# Patient Record
Sex: Female | Born: 2000 | Race: White | Hispanic: No | Marital: Single | Smoking: Never smoker
Health system: Southern US, Community
[De-identification: ages and names within clinical notes are randomized; demographics above are authoritative.]

## PROBLEM LIST (undated history)

## (undated) DIAGNOSIS — K900 Celiac disease: Secondary | ICD-10-CM

## (undated) DIAGNOSIS — K5900 Constipation, unspecified: Secondary | ICD-10-CM

## (undated) DIAGNOSIS — K589 Irritable bowel syndrome without diarrhea: Secondary | ICD-10-CM

## (undated) HISTORY — PX: UPPER GASTROINTESTINAL ENDOSCOPY: SHX188

## (undated) HISTORY — PX: COLONOSCOPY: SHX174

---
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
## 2020-02-08 ENCOUNTER — Ambulatory Visit: Payer: BC Managed Care – PPO | Admitting: Gastroenterology

## 2020-03-08 ENCOUNTER — Telehealth: Payer: Self-pay | Admitting: Gastroenterology

## 2020-03-08 NOTE — Telephone Encounter (Signed)
Inbound call from patient's mother wanting to schedule an appointment for patient stating she is having severe abdominal pain and cramping.  Informed her next availability will be 03/18/20 with an APP.  Requested a call back from a nurse please; wants to know if there's any way we can see patient this Friday.

## 2020-03-08 NOTE — Telephone Encounter (Signed)
Left message on machine to call back  

## 2020-03-09 ENCOUNTER — Telehealth: Payer: Self-pay | Admitting: Gastroenterology

## 2020-03-09 NOTE — Telephone Encounter (Signed)
Called patient's mother back, and she is very concerned about her daughter's worsening symptoms. She is having sever abdominal, twisting type pain that comes on abruptly. She also feels constantly bloated, even though she eats very little. She has the lactalose breath kit that was given to her at her 12/09/19 office visit, but has not done it because she thought it was pending some other results. She has an apt. On 03/28/20 with Colleen Kennedy PA, but really wants to see someone sooner. Dr. Nandigam, could I put her in one of your  HEM BAND spots on 03/14/20 ? 

## 2020-03-09 NOTE — Telephone Encounter (Signed)
Called patient's Mom and patient is in class right now. She will check with her this afternoon to see if she can make it here  03/14/20

## 2020-03-09 NOTE — Telephone Encounter (Signed)
Agree with sooner appt for office visit on 2/28. Thanks

## 2020-03-09 NOTE — Telephone Encounter (Signed)
Error

## 2020-03-09 NOTE — Telephone Encounter (Signed)
Patients mother is calling to follow up since the patient is not able to be reached. She scheduled a follow up appointment with APP 03/28/20 however the patient is getting worst and they are seeking advise in the meantime.

## 2020-03-10 NOTE — Telephone Encounter (Signed)
Returned call and spoke with the pt and she tells me that she will keep the appt for 3/14 and if she has any further concerns she will call back.   I mentioned her moms call and she states she is doing well at this time.

## 2020-03-10 NOTE — Telephone Encounter (Signed)
Patients mother called and said the patient is not able to come any sooner then the 03/28/20 appointment and she is asked if a nurse can call the patient today to get advise in the mean time.

## 2020-03-27 NOTE — Progress Notes (Signed)
03/27/2020 Veronica Ayala [id number] 05/27/2000   Chief Complaint: Celiac disease follow up, abdominal fullness   History of Present Illness: Veronica Ayala is a 20 year old female with a past medical history of celiac disease diagnosed in 2019, GERD, H. Pylori  and constipation. S/P appendectomy 05/2019. She was previously followed by a pediatric gastroenterologist at [facility] then established her GI care with Dr. Silverio Decamp. Refer to initial GI consult with Dr. Silverio Decamp 12/09/2019 for a comprehensive medical history review.   She presents today with persistent abdominal pain, bloat and intermittent diarrhea.  In general, she does not feel well.  She is concerned that her GI symptoms have not improved after maintaining a strict gluten free diet for the past 2 years.  She describes feeling as if food just sits in her stomach for hours at a time.  She describes having general abdominal pain and cramping which radiates to the lower abdomen.  No specific food triggers.  She tolerates small quantities of cheese and ice cream but typically avoids most dairy products.  She continues to have constipation. She has tried Linzess, Amitiza, MiraLAX, Dulcolax and Ex-Lax in the past without improvement.  She restarted taking MiraLAX 4 days ago.   She is a Electronics engineer at [employer].  She reports her from cafeteria has a specific food bar which contains gluten free foods.  Her most recent EGD was 07/16/2019 at [facility] which showed a normal esophagus and stomach.  The duodenum appeared normal but biopsies were consistent with focal villous blunting and increased intraepithelial lymphocytosis marsh type IIIa.  Gastric biopsies showed chronic inflammation, negative for H. pylori.  CTAP with contrast 05/24/2019: New inflammatory changes in the pelvis with increasing pelvic free fluid. Findings are suspicious for either acute uncomplicated appendicitis or terminal ileitis as detailed above.  EGD 07/16/2019 at  [facility]: Normal esophagus, stomach and duodenum.  Biopsy Report: A. DUODENUM, BIOPSY:  Duodenal mucosa with focal villous blunting and increased  intraepithelial lymphocytes (Marsh type 3a). B. STOMACH, BIOPSY:  Antral-type gastric mucosa with chronic inflammation.  No Helicobacter-like organism is identified by H&E stain  or immunohistochemistry H. pylori Clo Test Negative 07/16/2019  EGD 03/06/2018: Esophagus: Mucosa of the esophagus appeared normal.  Multiple biopsies were performed using cold forceps. Samples sent for histology. Stomach: Mucosa the stomach appeared normal.  Multiple biopsies were performed using cold forceps.  EGD 12/24/2017: 1.  The mucosa of the esophagus appeared normal; multiple biopsies were performed 2.  The mucosa of the stomach appeared normal; multiple biopsies were performed 3.  Abnormal mucosa was found in the duodenal bulb, first part duodenum and second part duodenum; the mucosa was congested and edematous; multiple biopsies were performed 4.  Retroflexion was performed in the stomach and revealed no abnormalities  Colonoscopy 12/24/2017: 1.  The colonic mucosa appeared normal throughout the entire examined colon and in the terminal ileum; multiple biopsies were performed using cold forceps. 2.  Retroflexion was not performed 3.  Revealed no abnormalities of the perianal region   No current outpatient medications on file prior to visit.   No current facility-administered medications on file prior to visit.   Allergies  Allergen Reactions  . Gluten Meal     Other reaction(s): GI Upset (intolerance) Pt has dx of celiac disease     Current Medications, Allergies, Past Medical History, Past Surgical History, Family History and Social History were reviewed in [employer] record.   Review of Systems:  Constitutional: Negative for fever, sweats, chills or weight loss.  Respiratory: Negative for shortness of  breath.   Cardiovascular: Negative for chest pain, palpitations and leg swelling.  Gastrointestinal: See HPI.  Musculoskeletal: Negative for back pain or muscle aches.  Neurological: Negative for dizziness, headaches or paresthesias.   Physical Exam: BP 100/60   Pulse 62   Ht 5' 6"  (1.676 m)   Wt 137 lb (62.1 kg)   SpO2 97%   BMI 22.11 kg/m   General: Well developed 20 year old female in no acute distress. Head: Normocephalic and atraumatic. Eyes: No scleral icterus. Conjunctiva pink . Ears: Normal auditory acuity. Lungs: Clear throughout to auscultation. Heart: Regular rate and rhythm, no murmur. Abdomen: Soft, nondistended. Mild tenderness to the right mid abdomen without rebound or guarding.  No masses or hepatomegaly. Normal bowel sounds x 4 quadrants.  Rectal: Deferred.  Musculoskeletal: Symmetrical with no gross deformities. Extremities: No edema. Neurological: Alert oriented x 4. No focal deficits.  Psychological: Alert and cooperative. Normal mood and affect  Assessment and Recommendations:  71.  20 year old female with celiac disease -Continue gluten-free diet -Check TTG IgA antibody and TTG IgG antibody to assess for inadvertent gluten exposure/digestion  2.  Generalized abdominal pain and bloat with constipation  -Discussed a trial with Xifaxan to empirically treat for SIBO, however, the patient prefers to undergo SIBO breath testing prior to initiating treatment -SIBO breath test -Lactaid with each dairy product -MiraLAX once to twice daily for now -Dicyclomine 10 mg 1 p.o. 3 times daily as needed abdominal pain -Consider small bowel MRI for rule out small bowel Crohn's disease  -Follow-up appoint with Dr. Silverio Decamp in 2 to 3 months -Patient to call our office if her symptoms worsen  3. S/P appendectomy secondary to acute appendicitis 05/2019. Path report - Acute appendicitis with necrosis, serositis and periappendiceal inflammation

## 2020-03-28 ENCOUNTER — Other Ambulatory Visit (INDEPENDENT_AMBULATORY_CARE_PROVIDER_SITE_OTHER): Payer: BC Managed Care – PPO

## 2020-03-28 ENCOUNTER — Encounter: Payer: Self-pay | Admitting: Nurse Practitioner

## 2020-03-28 ENCOUNTER — Ambulatory Visit (INDEPENDENT_AMBULATORY_CARE_PROVIDER_SITE_OTHER): Payer: BC Managed Care – PPO | Admitting: Nurse Practitioner

## 2020-03-28 VITALS — BP 100/60 | HR 62 | Ht 66.0 in | Wt 137.0 lb

## 2020-03-28 DIAGNOSIS — R197 Diarrhea, unspecified: Secondary | ICD-10-CM

## 2020-03-28 DIAGNOSIS — R140 Abdominal distension (gaseous): Secondary | ICD-10-CM

## 2020-03-28 DIAGNOSIS — K900 Celiac disease: Secondary | ICD-10-CM

## 2020-03-28 LAB — CBC WITH DIFFERENTIAL/PLATELET
Basophils Absolute: 0 10*3/uL (ref 0.0–0.1)
Basophils Relative: 0.9 % (ref 0.0–3.0)
Eosinophils Absolute: 0.1 10*3/uL (ref 0.0–0.7)
Eosinophils Relative: 1.8 % (ref 0.0–5.0)
HCT: 40.3 % (ref 36.0–46.0)
Hemoglobin: 13.8 g/dL (ref 12.0–15.0)
Lymphocytes Relative: 36.4 % (ref 12.0–46.0)
Lymphs Abs: 1.9 10*3/uL (ref 0.7–4.0)
MCHC: 34.1 g/dL (ref 30.0–36.0)
MCV: 84.7 fl (ref 78.0–100.0)
Monocytes Absolute: 0.5 10*3/uL (ref 0.1–1.0)
Monocytes Relative: 8.6 % (ref 3.0–12.0)
Neutro Abs: 2.8 10*3/uL (ref 1.4–7.7)
Neutrophils Relative %: 52.3 % (ref 43.0–77.0)
Platelets: 231 10*3/uL (ref 150.0–400.0)
RBC: 4.76 Mil/uL (ref 3.87–5.11)
RDW: 14.2 % (ref 11.5–14.6)
WBC: 5.3 10*3/uL (ref 4.5–10.5)

## 2020-03-28 LAB — C-REACTIVE PROTEIN: CRP: <1 mg/dL (ref 0.5–20.0)

## 2020-03-28 MED ORDER — DICYCLOMINE HCL 10 MG PO CAPS: 10.0000 mg | ORAL_CAPSULE | Freq: Three times a day (TID) | ORAL | 0 refills | Status: DC | PRN

## 2020-03-28 NOTE — Patient Instructions (Addendum)
If you are age 20 or younger, your body mass index should be between 19-25. Your Body mass index is 22.11 kg/m. If this is out of the aformentioned range listed, please consider follow up with your Primary Care Provider.   MEDICATION: We have sent the following medication to your pharmacy for you to pick up at your convenience: Dicyclomine 10 MG take 3 times a day as needed for abdominal pain.  Please take Lactaid with each dairy product.  We have scheduled you a follow up with Dr. Silverio Decamp on 06/02/20 at 9:50am  LABS:  Lab work has been ordered for you today. Our lab is located in the basement. Press "B" on the elevator. The lab is located at the first door on the left as you exit the elevator.  HEALTHCARE LAWS AND MY CHART RESULTS: Due to recent changes in healthcare laws, you may see the results of your imaging and laboratory studies on MyChart before your provider has had a chance to review them.   We understand that in some cases there may be results that are confusing or concerning to you. Not all laboratory results come back in the same time frame and the provider may be waiting for multiple results in order to interpret others.  Please give [place] 48 hours in order for your provider to thoroughly review all the results before contacting the office for clarification of your results.   You have been given a testing kit to check for small intestine bacterial overgrowth (SIBO) which is completed by a company named [facility].   Make sure to return your test in the mail using the return mailing label given to you along with the kit. Your demographic and insurance information have already been sent to the company and they should be in contact with you over the next week regarding this test.   [facility] will collect an upfront charge of $99.74 for commercial insurance plans and $209.74 is you are paying cash. Make sure to discuss with [facility] PRIOR to having the test if they have  gotten informatoin from your insurance company as to how much your testing will cost out of pocket, if any. Please keep in mind that you will be getting a call from phone number [phone] or a similar number. If you do not hear from them within this time frame, please call our office at [phone].   It was great seeing you today! Thank you for entrusting me with your care and choosing [facility].  Noralyn Pick, CRNP

## 2020-03-29 LAB — TISSUE TRANSGLUTAMINASE ABS,IGG,IGA
(tTG) Ab, IgA: 21.2 U/mL — ABNORMAL HIGH
(tTG) Ab, IgG: <1 U/mL

## 2020-03-29 LAB — IGA: Immunoglobulin A: 27 mg/dL — ABNORMAL LOW (ref 47–310)

## 2020-03-29 LAB — IGG: IgG (Immunoglobin G), Serum: 1443 mg/dL (ref 600–1640)

## 2020-04-01 NOTE — Telephone Encounter (Signed)
See lab notes and recommendations from Dr. Silverio Decamp.

## 2020-05-07 NOTE — Progress Notes (Signed)
Reviewed and agree with documentation and assessment and plan. K. Veena Nachmen Mansel , MD   

## 2020-06-02 ENCOUNTER — Ambulatory Visit: Payer: BC Managed Care – PPO | Admitting: Gastroenterology

## 2020-06-08 ENCOUNTER — Encounter: Payer: Self-pay | Admitting: Physician Assistant

## 2020-06-08 ENCOUNTER — Ambulatory Visit (INDEPENDENT_AMBULATORY_CARE_PROVIDER_SITE_OTHER): Payer: BC Managed Care – PPO | Admitting: Physician Assistant

## 2020-06-08 VITALS — BP 96/70 | HR 96 | Ht 66.0 in | Wt 141.2 lb

## 2020-06-08 DIAGNOSIS — K900 Celiac disease: Secondary | ICD-10-CM

## 2020-06-08 DIAGNOSIS — K5904 Chronic idiopathic constipation: Secondary | ICD-10-CM

## 2020-06-08 NOTE — Patient Instructions (Signed)
If you are age 20 or older, your body mass index should be between 23-30. Your Body mass index is 22.8 kg/m. If this is out of the aforementioned range listed, please consider follow up with your Primary Care Provider.  If you are age 21 or younger, your body mass index should be between 19-25. Your Body mass index is 22.8 kg/m. If this is out of the aformentioned range listed, please consider follow up with your Primary Care Provider.   You have been scheduled for an MRI at [facility] on 06/20/2020. Your appointment time is 10:00 am. Please arrive at 8:30 am for your appointment time for registration purposes. Please make certain not to have anything to eat or drink 6 hours prior to your test. In addition, if you have any metal in your body, have a pacemaker or defibrillator, please be sure to let your ordering physician know. This test typically takes 45 minutes to 1 hour to complete. Should you need to reschedule, please call [phone] to do so.  Continue Miralax daily Add Sena/Senakot 1-2 tablets at bedtime  Continue to follow a Gluten free diet.  Try Pepcid 1-2 tablets as needed before a meal.  Amy Esterwood, PA-C recommends that you complete a bowel purge (to clean out your bowels). Please do the following: Purchase a bottle of Miralax over the counter as well as a box of 5 mg dulcolax tablets. Take 4 dulcolax tablets. Wait 1 hour. You will then drink 6-8 capfuls of Miralax mixed in an adequate amount of water/juice/gatorade (you may choose which of these liquids to drink) over the next 2-3 hours. You should expect results within 1 to 6 hours after completing the bowel purge.  Try to do your bowel this Friday.  Follow up pending the results of your MR Enterography.  Thank you for entrusting me with your care and choosing [facility].  Amy Esterwood, PA-C

## 2020-06-09 ENCOUNTER — Encounter: Payer: Self-pay | Admitting: Physician Assistant

## 2020-06-09 NOTE — Progress Notes (Signed)
Subjective:    Patient ID: Veronica Ayala, female    DOB: [birth year], 20 y.o.   MRN: [id number]  HPI Veronica Ayala is a pleasant 20 year old white female, established with Dr. Nathaniel Ayala who was initially seen in consultation by Veronica Ayala on 12/09/2019 to establish with known previous diagnosis of celiac disease, with complaints of GERD constipation and abdominal fullness. She was diagnosed with celiac disease in December 2019 and was also H. pylori positive.  She completed a course of treatment for H. pylori and had confirmed eradication. She believes she has been adhering to a strictly gluten-free diet, and relates ongoing frustrations with symptoms despite being strict with maintaining gluten-free.  She is a Consulting civil engineer at [employer] and relates that they have a gluten-free section where she is currently living but next year will be in an apartment and has some concerns about ability to eat gluten-free. After her visit in November she had repeat TTG and IgA done.  TTG was elevated at 21.2, IgA within normal limits. Regarding constipation she had tried Amitiza and Linzess in the past both of which cause nausea, she has been using MiraLAX fairly regularly and does feel that this helps though still does not have regular bowel movements. Breath testing to exclude small intestinal bacterial overgrowth was to be done but she has not completed that. Sed rate was 6, B12 and folate levels normal, iron studies normal , And normal CBC.  She is expressing frustration today with ongoing symptoms and is not convinced that all of her symptoms are from celiac disease or that the celiac is active as she has been strict with maintaining gluten-free.  She says she has periods of time with decrease in appetite, general sense of fullness and bloating and a "backed up" feeling which is present most of the time.  She is having bowel movements with taking MiraLAX daily but says she only passes a small amount of stool  and this does not offer her any relief of symptoms.  She is not seeing any melena or hematochezia.  She had tried Protonix for short period of time for upper intestinal acid type sensation but she said this made her feel more uncomfortable. 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 Review of Systems Pertinent positive and negative review of systems were noted in the above HPI section.  All other review of systems was otherwise negative.  Outpatient Encounter Medications as of 06/08/2020  Medication Sig  . dicyclomine (BENTYL) 10 MG capsule Take 1 capsule (10 mg total) by mouth 3 (three) times daily as needed for spasms. (Patient taking differently: Take 10 mg by mouth as needed for spasms.)  . pantoprazole (PROTONIX) 20 MG tablet Take 20 mg by mouth as needed.   No facility-administered encounter medications on file as of 06/08/2020.   Allergies  Allergen Reactions  . Gluten Meal     Other reaction(s): GI Upset (intolerance) Pt has dx of celiac disease    Patient Active Problem List   Diagnosis Date Noted  . Appendicitis 05/25/2019  . Acute appendicitis with perforation, localized peritonitis, and gangrene, without abscess s/p lap appendectomy 05/24/2019 05/24/2019  . Celiac disease 12/30/2017  . H. pylori infection 12/30/2017  . Abdominal pain, chronic, generalized 12/05/2017  . Irritable bowel syndrome with constipation 12/05/2017  . Defecation urgency 12/05/2017  . Early satiety 12/05/2017  . Rectal tenesmus 12/05/2017  . Decreased appetite 12/05/2017  . 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 Marital status: Single    Spouse name: Not on file  . Number of children: Not on file  . Years of education: Not on file  . Highest education level: Not on file  Occupational History  . Not on file  Tobacco Use  . Smoking status: Never Smoker  . Smokeless tobacco: Never Used  Vaping Use  . Vaping Use: Never used   Substance and Sexual Activity  . Alcohol use: Never  . Drug use: Never  . Sexual activity: Yes    Birth control/protection: None  Other Topics Concern  . Not on file  Social History Narrative  . Not on file   Social Determinants of Health   Financial Resource Strain: Not on file  Food Insecurity: Not on file  Transportation Needs: Not on file  Physical Activity: Not on file  Stress: Not on file  Social Connections: Not on file  Intimate Partner Violence: Not on file    Ms. [name]'s family history includes Breast cancer in her maternal grandmother; Prostate cancer in her maternal grandfather.      Objective:    Vitals:   06/08/20 1512  BP: 96/70  Pulse: 96    Physical Exam Well-developed well-nourished young WF  in no acute distress.  Height, ONGEXB,284  BMI22.8 accompanied by mom  HEENT; nontraumatic normocephalic, EOMI, PE R LA, sclera anicteric. Oropharynx; not examined today Neck; supple, no JVD Cardiovascular; regular rate and rhythm with S1-S2, no murmur rub or gallop Pulmonary; Clear bilaterally Abdomen; soft, no focal tenderness, , nondistended, no palpable mass or hepatosplenomegaly, bowel sounds are active Rectal; not done Skin; benign exam, no jaundice rash or appreciable lesions Extremities; no clubbing cyanosis or edema skin warm and dry Neuro/Psych; alert and oriented x4, grossly nonfocal mood and affect appropriate       Assessment & Plan:   #42 20 year old female diagnosed with celiac disease in 2019 with prior work-up being done through her pediatrician. Patient has persistent symptoms of ongoing abdominal discomfort, bloating, intermittent indigestion and acidic symptoms and ongoing issues with constipation despite following a strict gluten-free diet  TTG in November 2021 elevated at 21.2  Previous trials of Linzess and Amitiza, intolerant secondary to nausea Prior trial of Protonix, no benefit  It is not clear if all of her symptoms are  secondary to active celiac disease though elevated TTG certainly suggest that, she may have overlap of IBS, cannot rule out SIBO, rule out IBD, rule out refractory celiac, rule out jejunitis  Plan; continue strict gluten-free diet Patient will complete breath testing to rule out SIBO this week, treat with Xifaxan if positive Patient will continue educating herself regarding options for strict gluten-free diet, she is advised to read about different alcohols and potential gluten content for option socially etc. For significant constipation we have discussed a bowel purge with MiraLAX which she will complete later this week and then go back to MiraLAX 17 g in 8 ounces of water once daily. Add trial of Senokot-S 1-2 at bedtime which she can do in addition to MiraLAX. Trial of Pepcid AC 1-2 as needed as needed Patient will be scheduled for MR enterography. Further plans pending results of above.    Floyde Dingley S Jenilee Franey PA-C 06/09/2020   Cc: Marcene Corning, MD

## 2020-06-13 DIAGNOSIS — R140 Abdominal distension (gaseous): Secondary | ICD-10-CM | POA: Diagnosis not present

## 2020-06-13 DIAGNOSIS — K900 Celiac disease: Secondary | ICD-10-CM | POA: Diagnosis not present

## 2020-06-13 DIAGNOSIS — R197 Diarrhea, unspecified: Secondary | ICD-10-CM | POA: Diagnosis not present

## 2020-06-16 NOTE — Progress Notes (Signed)
Reviewed and agree with documentation and assessment and plan. K. Veena Caileigh Canche , MD   

## 2020-06-20 ENCOUNTER — Ambulatory Visit (HOSPITAL_COMMUNITY)
Admission: RE | Admit: 2020-06-20 | Discharge: 2020-06-20 | Disposition: A | Discharge status: Home / Self Care | Payer: BC Managed Care – PPO | Source: Ambulatory Visit | Attending: Physician Assistant | Admitting: Physician Assistant

## 2020-06-20 ENCOUNTER — Other Ambulatory Visit: Payer: Self-pay

## 2020-06-20 DIAGNOSIS — K900 Celiac disease: Secondary | ICD-10-CM | POA: Diagnosis not present

## 2020-06-20 DIAGNOSIS — Z8719 Personal history of other diseases of the digestive system: Secondary | ICD-10-CM | POA: Diagnosis not present

## 2020-06-20 DIAGNOSIS — K5904 Chronic idiopathic constipation: Secondary | ICD-10-CM | POA: Insufficient documentation

## 2020-06-20 DIAGNOSIS — M419 Scoliosis, unspecified: Secondary | ICD-10-CM | POA: Diagnosis not present

## 2020-06-20 DIAGNOSIS — K5900 Constipation, unspecified: Secondary | ICD-10-CM | POA: Diagnosis not present

## 2020-06-20 DIAGNOSIS — R140 Abdominal distension (gaseous): Secondary | ICD-10-CM | POA: Diagnosis not present

## 2020-06-20 IMAGING — MR MR [PERSON_NAME] PELVIS W/CM
23 of 25 series · 46 of 48 positions shown · IV contrast (6ml GADAVIST)
Comparison: Multiple exams, including CT abdomen [DATE]

CLINICAL DATA: Celiac disease in chronic idiopathic constipation.
Ongoing abdominal discomfort with bloating and intermittent
indigestion. Gluten free diet.

EXAM:
MR ABDOMEN AND PELVIS WITHOUT AND WITH CONTRAST (MR ENTEROGRAPHY)
TECHNIQUE: Multiplanar, multisequence MRI of the abdomen and pelvis was
performed both before and during bolus administration of intravenous
contrast. Negative oral contrast VoLumen was given.
CONTRAST:  6mL GADAVIST GADOBUTROL 1 MMOL/ML IV SOLN

[Series 6: T2 · axial · 6.0mm · 1.45mm/px · 1 of 60 slices shown (1 of 2)]
[im 1/60]
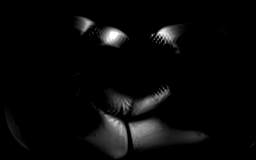

[Series 7: T2 · coronal · 6.0mm · 1.76mm/px · 1 of 32 slices shown (2 of 2)]
[im 1/32]
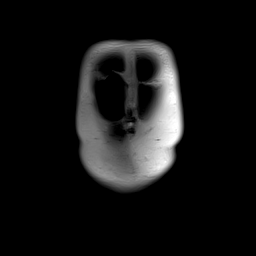

[Series 8: bSSFP · coronal · 6.0mm · 0.88mm/px · 1 of 32 slices shown]
[im 1/32]
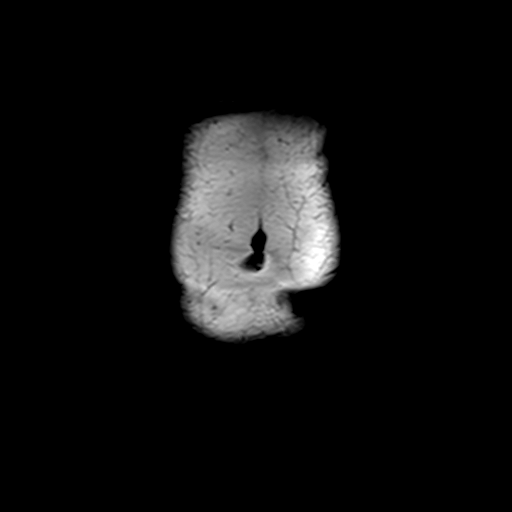

[Series 9: DWI · axial · 6.0mm · 1.49mm/px · z∈[-283,+127]mm · 2 of 115 slices shown (1 of 2)]
[im 1/115]
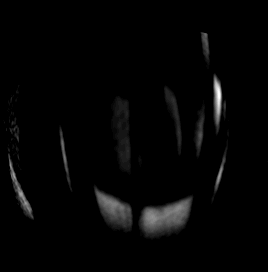
[im 115/115]
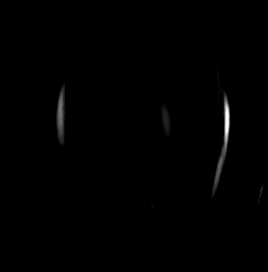

[Series 10: DWI · axial · 6.0mm · 1.49mm/px · 1 of 57 slices shown (2 of 2)]
[im 1/57]
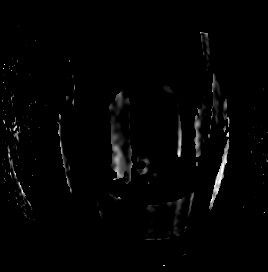

[Series 11: T1 dynamic · axial · 3.0mm · 1.16mm/px · z∈[-272,+109]mm · 2 of 128 slices shown (1 of 12)]
[im 1/128]
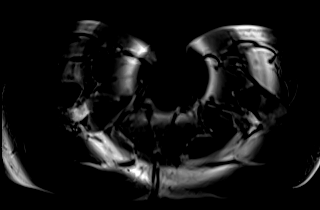
[im 128/128]
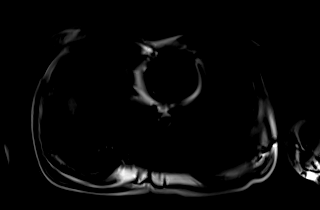

[Series 12: T1 dynamic · axial · 3.0mm · 1.16mm/px · z∈[-272,+109]mm · 3 of 128 slices shown (2 of 12)]
[im 1/128]
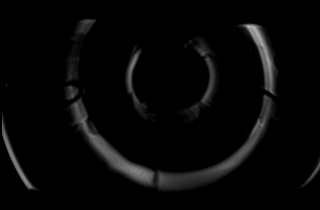
[im 64/128]
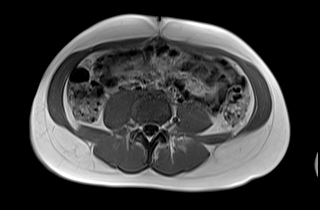
[im 128/128]
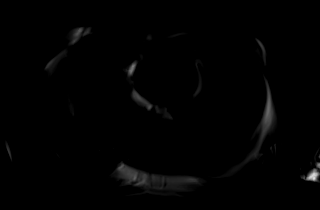

[Series 14: T1 dynamic · axial · 3.0mm · 1.16mm/px · z∈[-272,+109]mm · 3 of 128 slices shown (3 of 12)]
[im 1/128]
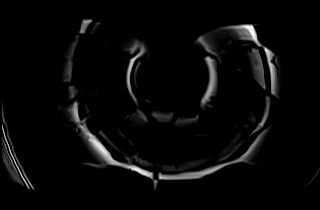
[im 64/128]
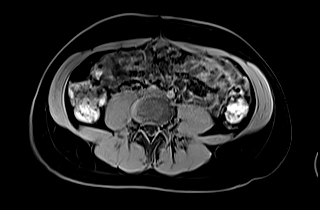
[im 128/128]
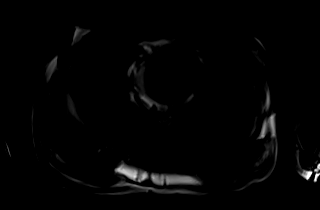

[Series 22: T1 dynamic · axial · 3.0mm · 1.16mm/px · z∈[-272,+109]mm · 3 of 128 slices shown (4 of 12)]
[im 1/128]
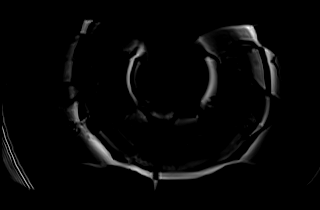
[im 64/128]
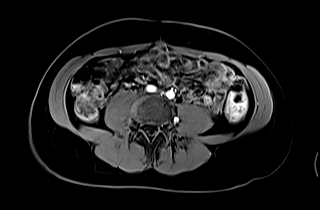
[im 128/128]
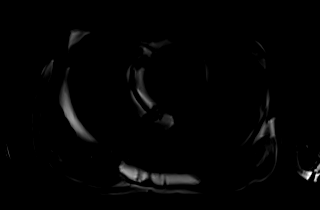

[Series 23: T1 dynamic · axial · 3.0mm · 1.16mm/px · z∈[-272,+109]mm · 3 of 128 slices shown (5 of 12)]
[im 1/128]
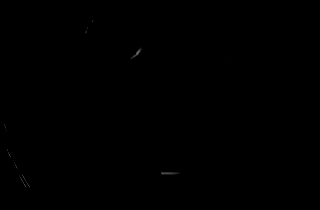
[im 64/128]
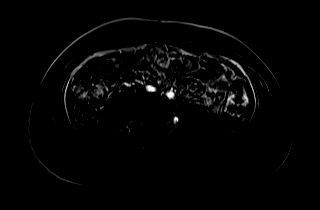
[im 128/128]
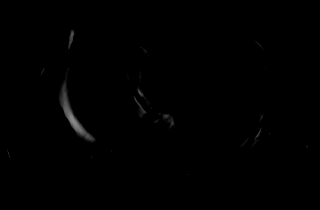

[Series 30: T1 dynamic · axial · 3.0mm · 1.16mm/px · z∈[-272,+109]mm · 3 of 128 slices shown (6 of 12)]
[im 1/128]
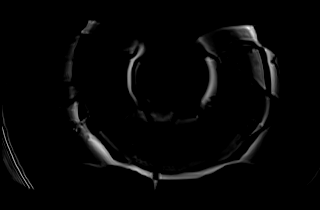
[im 64/128]
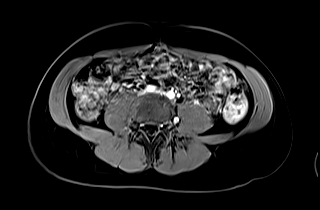
[im 128/128]
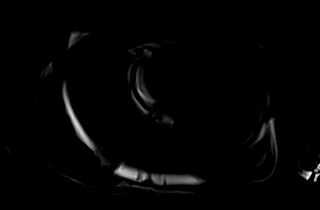

[Series 31: T1 dynamic · axial · 3.0mm · 1.16mm/px · z∈[-272,+109]mm · 3 of 128 slices shown (7 of 12)]
[im 1/128]
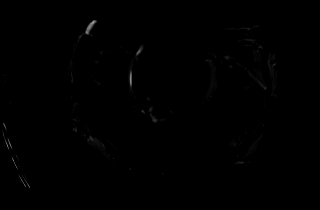
[im 64/128]
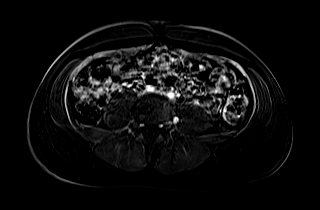
[im 128/128]
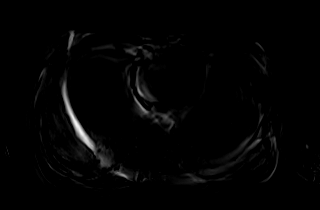

[Series 38: T1 dynamic · axial · 3.0mm · 1.16mm/px · z∈[-272,+109]mm · 3 of 128 slices shown (8 of 12)]
[im 1/128]
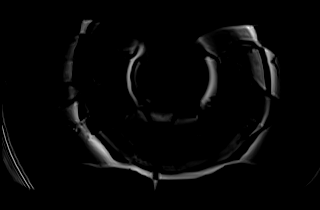
[im 64/128]
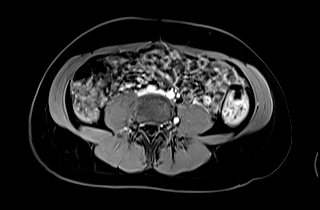
[im 128/128]
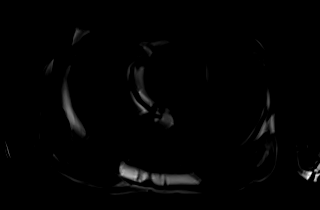

[Series 39: T1 dynamic · axial · 3.0mm · 1.16mm/px · z∈[-272,+109]mm · 3 of 128 slices shown (9 of 12)]
[im 1/128]
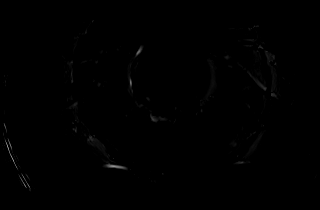
[im 64/128]
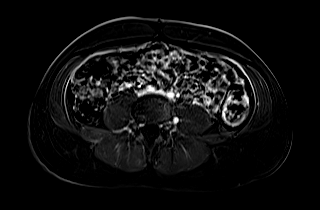
[im 128/128]
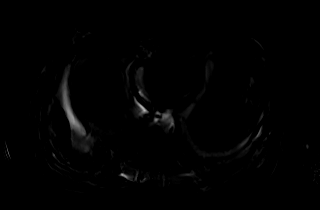

[Series 41: T1 dynamic · coronal · 3.0mm · 1.41mm/px · 2 of 64 slices shown (10 of 12)]
[im 1/64]
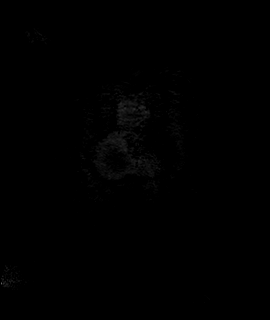
[im 64/64]
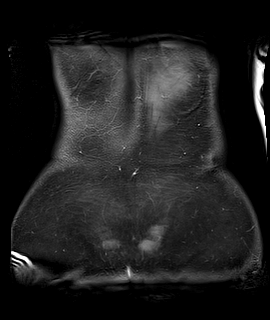

[Series 48: T1 dynamic · axial · 3.0mm · 1.16mm/px · z∈[-272,+109]mm · 3 of 128 slices shown (11 of 12)]
[im 1/128]
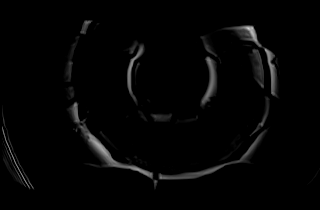
[im 64/128]
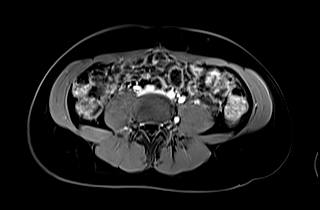
[im 128/128]
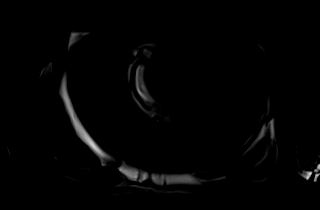

[Series 49: T1 dynamic · axial · 3.0mm · 1.16mm/px · z∈[-272,+109]mm · 3 of 128 slices shown (12 of 12)]
[im 1/128]
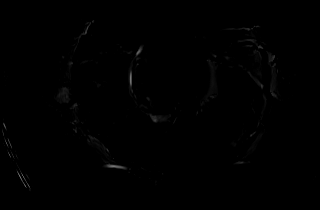
[im 64/128]
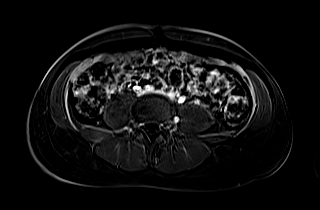
[im 128/128]
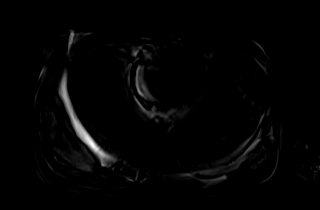

[Series 50: cor cine true-fisp · coronal · 10.0mm · 0.74mm/px · 1 of 3 slices shown (1 of 6)]
[im 1/3]
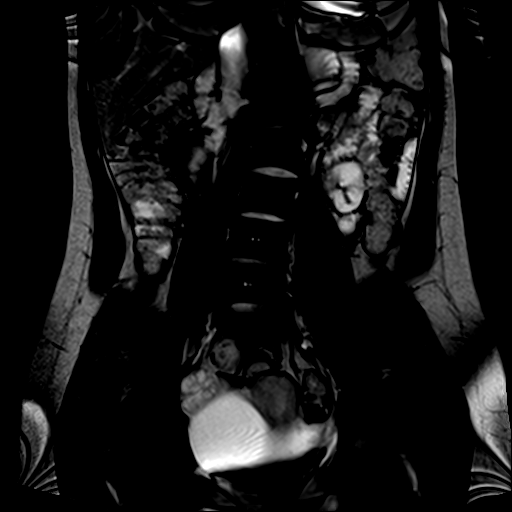

[Series 51: cor cine true-fisp · coronal · 10.0mm · 0.74mm/px · 1 of 3 slices shown (2 of 6)]
[im 1/3]
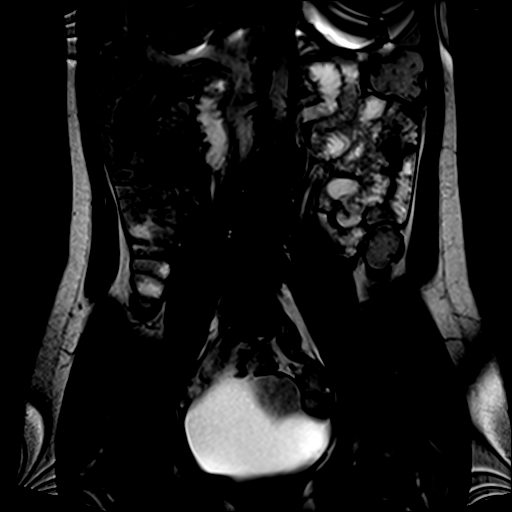

[Series 52: cor cine true-fisp · coronal · 10.0mm · 0.74mm/px · 1 of 3 slices shown (3 of 6)]
[im 1/3]
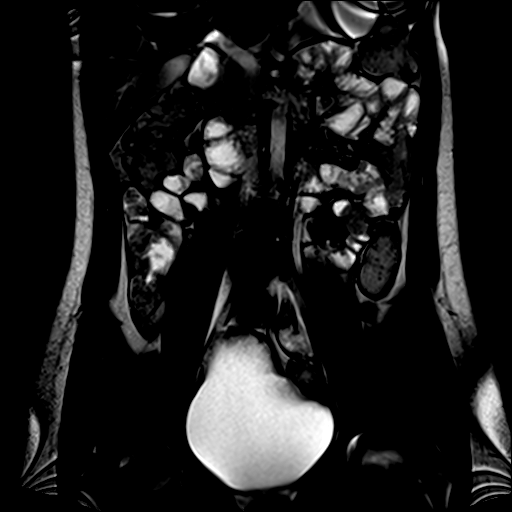

[Series 53: cor cine true-fisp · coronal · 10.0mm · 0.74mm/px · 1 of 3 slices shown (4 of 6)]
[im 1/3]
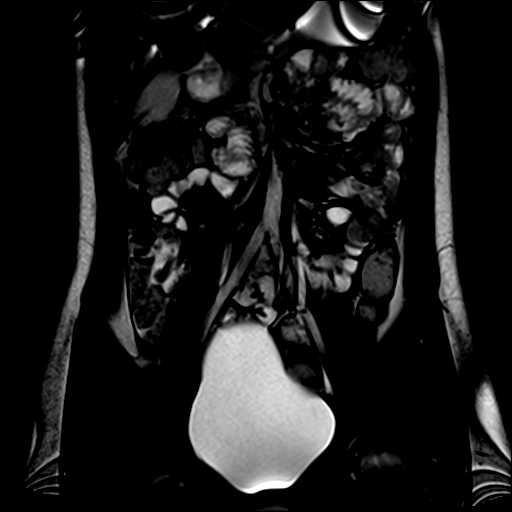

[Series 54: cor cine true-fisp · coronal · 10.0mm · 0.74mm/px · 1 of 3 slices shown (5 of 6)]
[im 1/3]
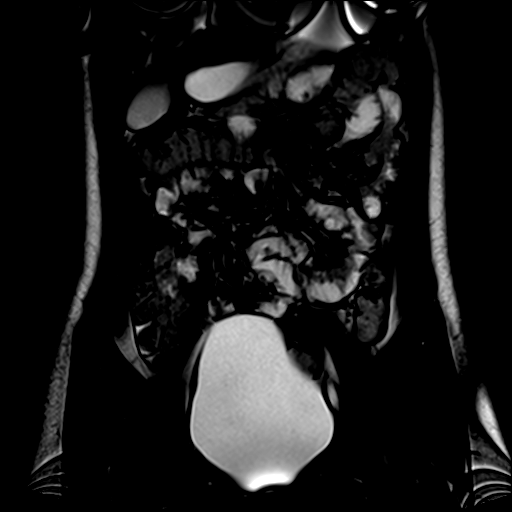

[Series 55: cor cine true-fisp · coronal · 10.0mm · 0.74mm/px · 1 of 3 slices shown (6 of 6)]
[im 1/3]
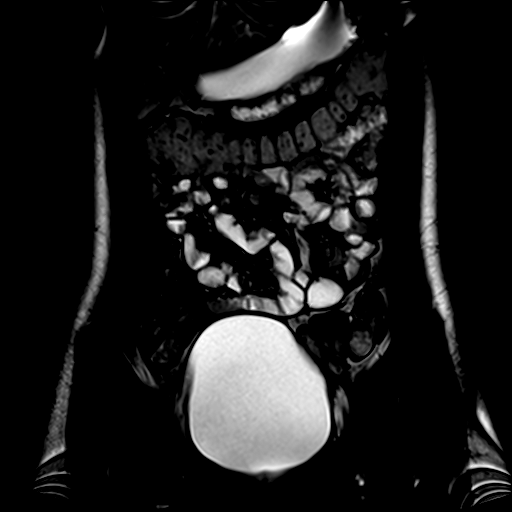

[46 of 48 positions shown; findings below may reference images not displayed]

FINDINGS: COMBINED FINDINGS FOR BOTH MR ABDOMEN AND PELVIS

Despite efforts by the technologist and patient, motion artifact is
present on today's exam and could not be eliminated. This reduces
exam sensitivity and specificity.

Lower chest: Unremarkable

Hepatobiliary: Unremarkable

Pancreas:  Unremarkable

Spleen:  Unremarkable

Adrenals/Urinary Tract: 0.8 cm left mid kidney cyst. Adrenal glands
unremarkable. Otherwise unremarkable.

Stomach/Bowel: The distal esophagus and stomach appear unremarkable.
The duodenal bulb and the remainder of the duodenum demonstrates
normal caliber and wall appearance without specific inflammatory
findings.

Jejunal folds appear unremarkable and non-thickened at this time. No
dilated bowel or abnormal bowel wall mucosal enhancement. The
terminal ileum appears normal and no specific mass is observed.
Distal colonic stool appears semi solid with some fluid levels in
the descending colon. Overall no active inflammation or specific
abnormality is identified.

Vascular/Lymphatic:  Unremarkable

Reproductive: Unremarkable

Other:  Trace free pelvic fluid.

Musculoskeletal: Mild levoconvex thoracolumbar scoliosis with
epicenter at the L1 vertebral level. Moderate rotary component.
IMPRESSION: 1. There are some air fluid levels in nondilated descending colon
but distal colon contents appear semi solid and there is no wall
thickening, abnormal mucosal or wall enhancement, or other specific
gastric or bowel abnormality identified. The duodenum appears
unremarkable.
2. Trace free pelvic fluid-possibly physiologic.
3. Levoconvex thoracolumbar scoliosis with rotary component.

## 2020-06-20 IMAGING — MR MR [PERSON_NAME] ABD W/CM
23 of 25 series · 46 of 48 positions shown · IV contrast (6ml GADAVIST)
Comparison: Multiple exams, including CT abdomen [DATE]

CLINICAL DATA: Celiac disease in chronic idiopathic constipation.
Ongoing abdominal discomfort with bloating and intermittent
indigestion. Gluten free diet.

EXAM:
MR ABDOMEN AND PELVIS WITHOUT AND WITH CONTRAST (MR ENTEROGRAPHY)
TECHNIQUE: Multiplanar, multisequence MRI of the abdomen and pelvis was
performed both before and during bolus administration of intravenous
contrast. Negative oral contrast VoLumen was given.
CONTRAST:  6mL GADAVIST GADOBUTROL 1 MMOL/ML IV SOLN

[Series 6: T2 · axial · 6.0mm · 1.45mm/px · 1 of 60 slices shown (1 of 2)]
[im 1/60]
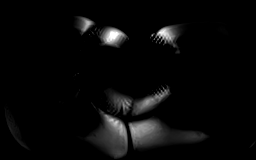

[Series 7: T2 · coronal · 6.0mm · 1.76mm/px · 1 of 32 slices shown (2 of 2)]
[im 1/32]
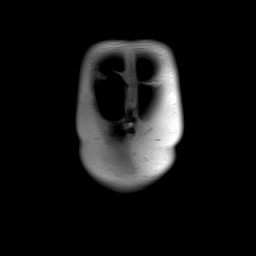

[Series 8: bSSFP · coronal · 6.0mm · 0.88mm/px · 1 of 32 slices shown]
[im 1/32]
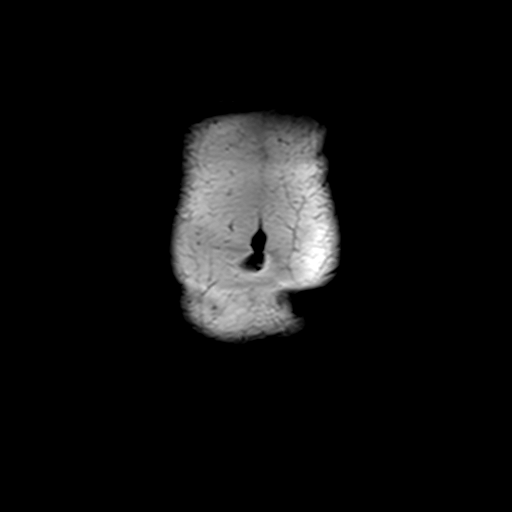

[Series 9: DWI · axial · 6.0mm · 1.49mm/px · z∈[-283,+127]mm · 2 of 115 slices shown (1 of 2)]
[im 1/115]
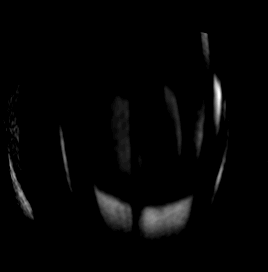
[im 115/115]
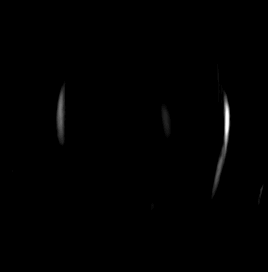

[Series 10: DWI · axial · 6.0mm · 1.49mm/px · 1 of 57 slices shown (2 of 2)]
[im 1/57]
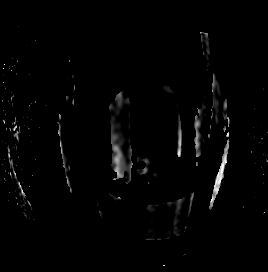

[Series 11: T1 dynamic · axial · 3.0mm · 1.16mm/px · z∈[-272,+109]mm · 2 of 128 slices shown (1 of 12)]
[im 1/128]
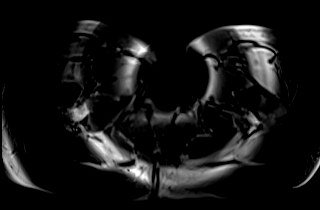
[im 128/128]
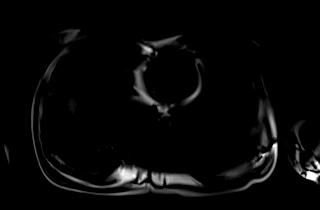

[Series 12: T1 dynamic · axial · 3.0mm · 1.16mm/px · z∈[-272,+109]mm · 3 of 128 slices shown (2 of 12)]
[im 1/128]
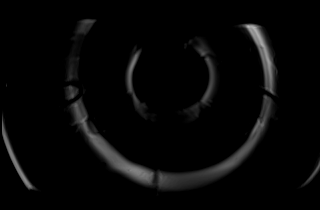
[im 64/128]
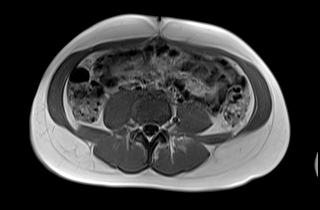
[im 128/128]
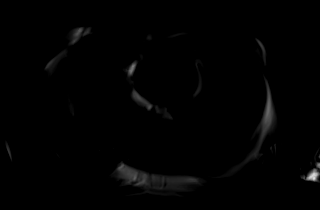

[Series 14: T1 dynamic · axial · 3.0mm · 1.16mm/px · z∈[-272,+109]mm · 3 of 128 slices shown (3 of 12)]
[im 1/128]
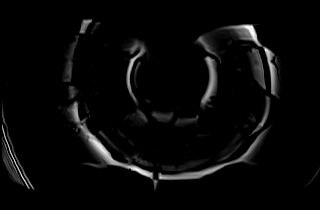
[im 64/128]
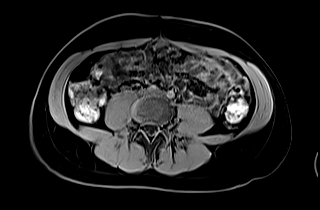
[im 128/128]
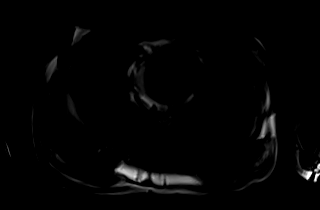

[Series 22: T1 dynamic · axial · 3.0mm · 1.16mm/px · z∈[-272,+109]mm · 3 of 128 slices shown (4 of 12)]
[im 1/128]
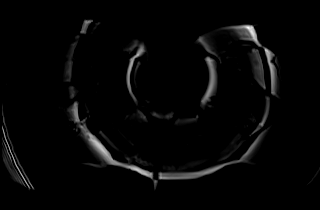
[im 64/128]
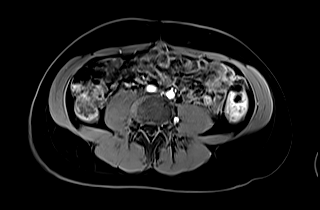
[im 128/128]
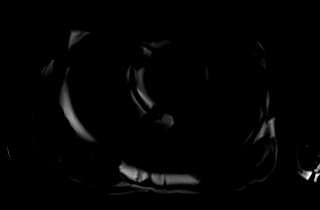

[Series 23: T1 dynamic · axial · 3.0mm · 1.16mm/px · z∈[-272,+109]mm · 3 of 128 slices shown (5 of 12)]
[im 1/128]
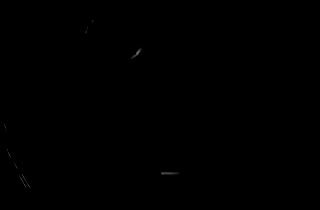
[im 64/128]
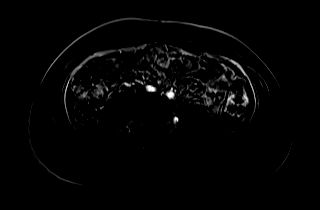
[im 128/128]
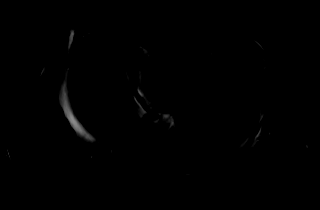

[Series 30: T1 dynamic · axial · 3.0mm · 1.16mm/px · z∈[-272,+109]mm · 3 of 128 slices shown (6 of 12)]
[im 1/128]
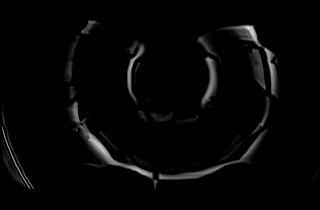
[im 64/128]
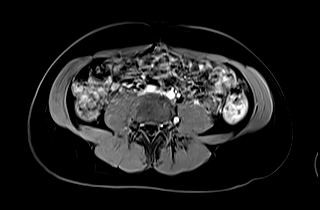
[im 128/128]
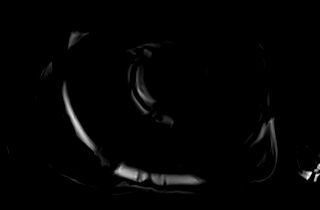

[Series 31: T1 dynamic · axial · 3.0mm · 1.16mm/px · z∈[-272,+109]mm · 3 of 128 slices shown (7 of 12)]
[im 1/128]
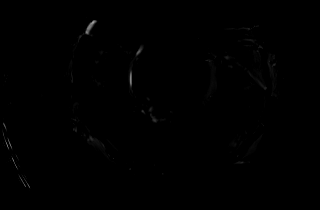
[im 64/128]
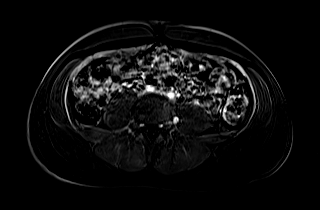
[im 128/128]
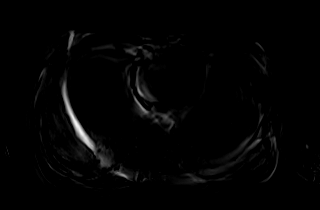

[Series 38: T1 dynamic · axial · 3.0mm · 1.16mm/px · z∈[-272,+109]mm · 3 of 128 slices shown (8 of 12)]
[im 1/128]
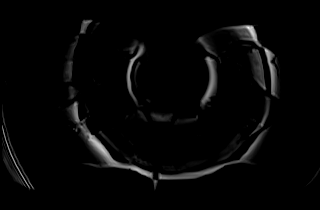
[im 64/128]
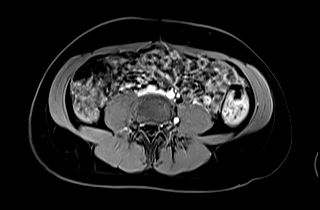
[im 128/128]
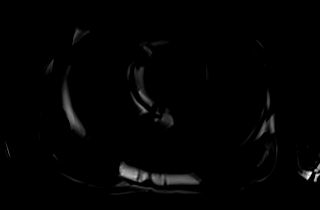

[Series 39: T1 dynamic · axial · 3.0mm · 1.16mm/px · z∈[-272,+109]mm · 3 of 128 slices shown (9 of 12)]
[im 1/128]
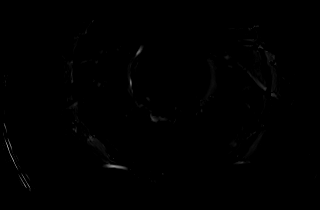
[im 64/128]
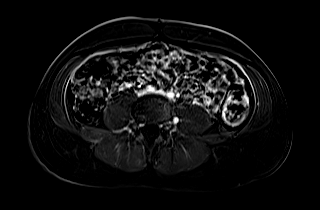
[im 128/128]
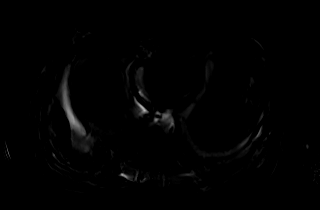

[Series 41: T1 dynamic · coronal · 3.0mm · 1.41mm/px · 2 of 64 slices shown (10 of 12)]
[im 1/64]
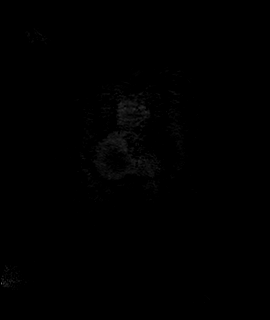
[im 64/64]
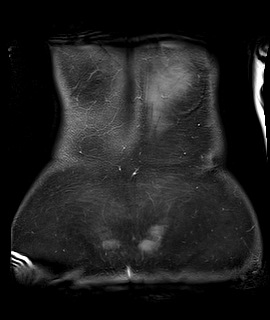

[Series 48: T1 dynamic · axial · 3.0mm · 1.16mm/px · z∈[-272,+109]mm · 3 of 128 slices shown (11 of 12)]
[im 1/128]
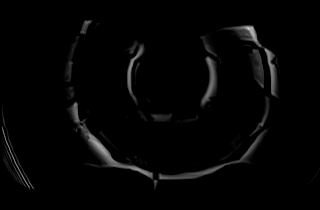
[im 64/128]
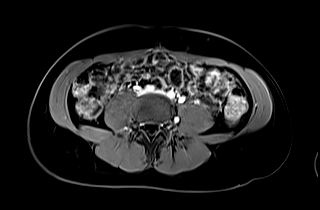
[im 128/128]
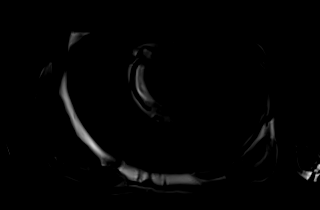

[Series 49: T1 dynamic · axial · 3.0mm · 1.16mm/px · z∈[-272,+109]mm · 3 of 128 slices shown (12 of 12)]
[im 1/128]
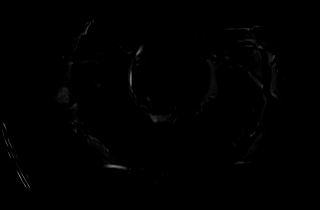
[im 64/128]
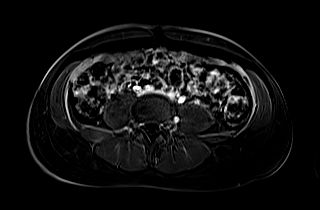
[im 128/128]
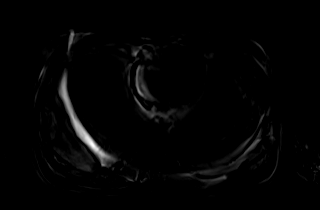

[Series 50: cor cine true-fisp · coronal · 10.0mm · 0.74mm/px · 1 of 3 slices shown (1 of 6)]
[im 1/3]
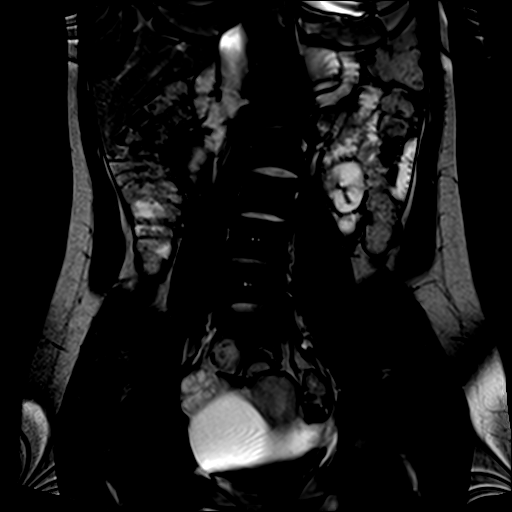

[Series 51: cor cine true-fisp · coronal · 10.0mm · 0.74mm/px · 1 of 3 slices shown (2 of 6)]
[im 1/3]
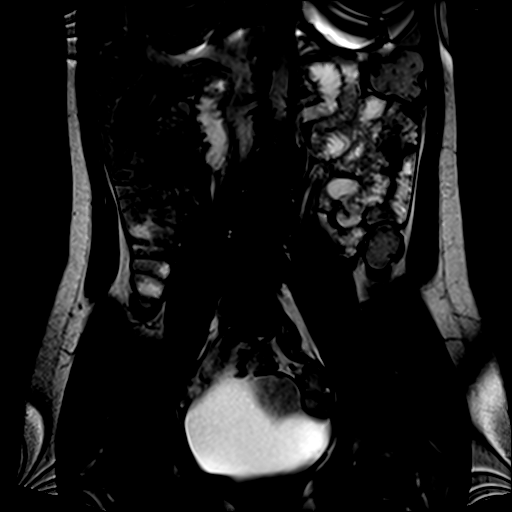

[Series 52: cor cine true-fisp · coronal · 10.0mm · 0.74mm/px · 1 of 3 slices shown (3 of 6)]
[im 1/3]
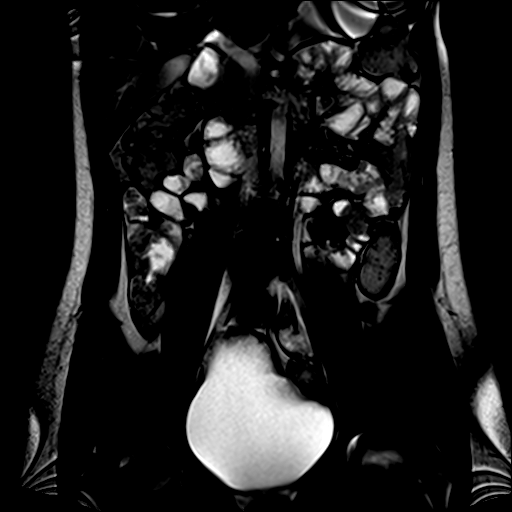

[Series 53: cor cine true-fisp · coronal · 10.0mm · 0.74mm/px · 1 of 3 slices shown (4 of 6)]
[im 1/3]
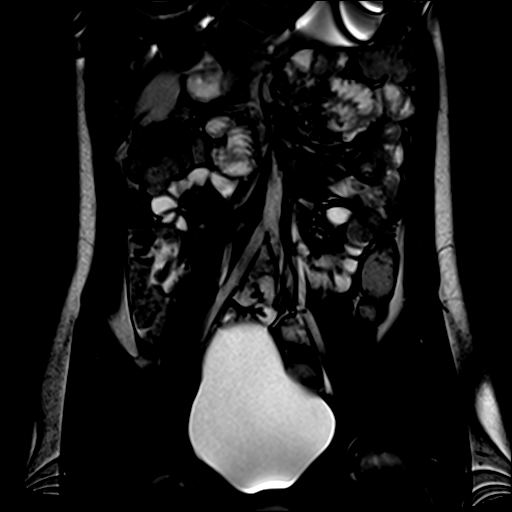

[Series 54: cor cine true-fisp · coronal · 10.0mm · 0.74mm/px · 1 of 3 slices shown (5 of 6)]
[im 1/3]
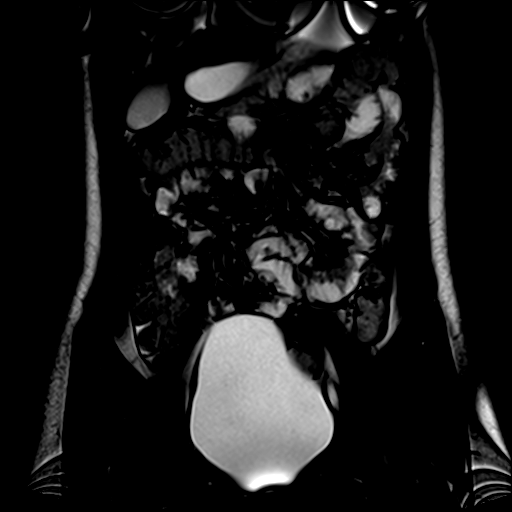

[Series 55: cor cine true-fisp · coronal · 10.0mm · 0.74mm/px · 1 of 3 slices shown (6 of 6)]
[im 1/3]
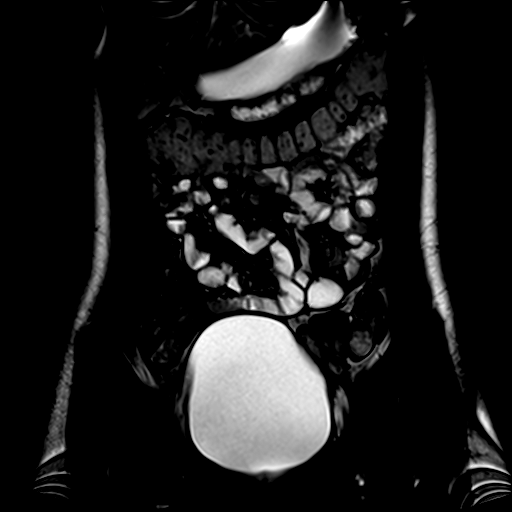

[46 of 48 positions shown; findings below may reference images not displayed]

FINDINGS: COMBINED FINDINGS FOR BOTH MR ABDOMEN AND PELVIS

Despite efforts by the technologist and patient, motion artifact is
present on today's exam and could not be eliminated. This reduces
exam sensitivity and specificity.

Lower chest: Unremarkable

Hepatobiliary: Unremarkable

Pancreas:  Unremarkable

Spleen:  Unremarkable

Adrenals/Urinary Tract: 0.8 cm left mid kidney cyst. Adrenal glands
unremarkable. Otherwise unremarkable.

Stomach/Bowel: The distal esophagus and stomach appear unremarkable.
The duodenal bulb and the remainder of the duodenum demonstrates
normal caliber and wall appearance without specific inflammatory
findings.

Jejunal folds appear unremarkable and non-thickened at this time. No
dilated bowel or abnormal bowel wall mucosal enhancement. The
terminal ileum appears normal and no specific mass is observed.
Distal colonic stool appears semi solid with some fluid levels in
the descending colon. Overall no active inflammation or specific
abnormality is identified.

Vascular/Lymphatic:  Unremarkable

Reproductive: Unremarkable

Other:  Trace free pelvic fluid.

Musculoskeletal: Mild levoconvex thoracolumbar scoliosis with
epicenter at the L1 vertebral level. Moderate rotary component.
IMPRESSION: 1. There are some air fluid levels in nondilated descending colon
but distal colon contents appear semi solid and there is no wall
thickening, abnormal mucosal or wall enhancement, or other specific
gastric or bowel abnormality identified. The duodenum appears
unremarkable.
2. Trace free pelvic fluid-possibly physiologic.
3. Levoconvex thoracolumbar scoliosis with rotary component.

## 2020-06-20 MED ORDER — GADOBUTROL 1 MMOL/ML IV SOLN
6.0000 mL | Freq: Once | INTRAVENOUS | Status: AC | PRN
  Administered 2020-06-20: 6 mL via INTRAVENOUS

## 2020-07-07 ENCOUNTER — Telehealth: Payer: Self-pay | Admitting: Physician Assistant

## 2020-07-07 NOTE — Telephone Encounter (Signed)
Inbound call from patient's mother wanting to get an update on patient's SIBO test results please.

## 2020-07-08 NOTE — Telephone Encounter (Signed)
Tried to reach out to patient's mother Karna Christmas. Unable to leave a voicemail, but have sent a message to the patient.

## 2020-07-13 ENCOUNTER — Telehealth: Payer: Self-pay

## 2020-07-13 NOTE — Telephone Encounter (Signed)
[place], [place], PA-C  [place], [place], [place] Please call patient and mom and let them know the breath test for bacterial overgrowth has returned indeterminant.  Given her diagnosis of celiac disease and symptoms of bloating and abdominal discomfort I would favor going ahead with treatment with a course of Xifaxan 550 mg p.o. twice daily x14 days.  If patient agreeable please send prescription, thank you  Please convert this note to a phone note so will be part of chart

## 2020-07-13 NOTE — Telephone Encounter (Signed)
Tried to call the patient, no answer, voicemail was full and could not leave a Advertising account executive

## 2020-07-13 NOTE — Telephone Encounter (Signed)
Tried to contact patient again.

## 2020-07-13 NOTE — Telephone Encounter (Signed)
Called [facility] a second time to get a copy of the patient's results

## 2020-07-13 NOTE — Telephone Encounter (Signed)
I have let the patient know her results and discussed starting Xifaxan. She is currently out of town and wants to wait until she gets back. We discussed that I would need to do a prior authorization for this before she can pick it up. She is aware to call me a week before she gets back so I can send in her prescription and start her prior authorization.

## 2020-08-11 DIAGNOSIS — R499 Unspecified voice and resonance disorder: Secondary | ICD-10-CM | POA: Diagnosis not present

## 2020-09-23 DIAGNOSIS — K6389 Other specified diseases of intestine: Secondary | ICD-10-CM

## 2020-09-27 ENCOUNTER — Telehealth: Payer: Self-pay

## 2020-09-27 MED ORDER — RIFAXIMIN 550 MG PO TABS: 550.0000 mg | ORAL_TABLET | Freq: Two times a day (BID) | ORAL | 0 refills | Status: AC

## 2020-09-27 NOTE — Telephone Encounter (Signed)
PA for Xifaxin sent via cover my meds

## 2020-10-05 NOTE — Telephone Encounter (Signed)
Noted, agree with Beth's recommendations. She should hold Miralax if she has diarrhea. To consider starting Florastor one po bid if symptoms persist.

## 2020-10-10 ENCOUNTER — Other Ambulatory Visit: Payer: Self-pay

## 2020-10-10 NOTE — Telephone Encounter (Signed)
Beth, I last saw this patient in office 03/2020, Amy saw her in office 05/2020.  Too many sx to sort through. I would advise for patient to follow up with Amy, me or Dr. Silverio Decamp. Corrin Parker

## 2020-10-24 NOTE — Telephone Encounter (Signed)
Beth, I am actually out of the office on vacation during the patient's available October dates.  She possibly may be inadvertently exposed to gluten which triggers her GI symptoms.  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  If her celiac antibodies are positive she will need nutritional consult and possible referral to a celiac specialist.  Also check a CBC and CMP.  Schedule her for a follow-up appoint with Dr. Silverio Decamp during her next school break.  Thank you

## 2020-10-25 ENCOUNTER — Other Ambulatory Visit: Payer: Self-pay

## 2020-10-25 ENCOUNTER — Telehealth: Payer: Self-pay

## 2020-10-25 DIAGNOSIS — K5904 Chronic idiopathic constipation: Secondary | ICD-10-CM

## 2020-10-25 DIAGNOSIS — K6389 Other specified diseases of intestine: Secondary | ICD-10-CM

## 2020-10-25 DIAGNOSIS — K900 Celiac disease: Secondary | ICD-10-CM

## 2020-10-25 NOTE — Telephone Encounter (Signed)
Terri (mom) of patient called back and I let her know we had ordered some blood work for her daughter. Also asked if the patient could make an office appt. On 11/02/20 at 10:40 am with Dr. Silverio Decamp. She agreed with both. States the patient is feeling hopeless with her symptoms.

## 2020-10-25 NOTE — Telephone Encounter (Signed)
Called patient's Mom at requested ph# and left message to please call back. Also ordered labs (per Cottie Banda NP) and scheduled office visit with Dr.Nandigam on 11/02/20. Sent patient MyChart message with the above information.

## 2020-10-25 NOTE — Telephone Encounter (Signed)
See other phone note

## 2020-11-02 ENCOUNTER — Ambulatory Visit: Payer: BC Managed Care – PPO | Admitting: Gastroenterology

## 2020-11-02 ENCOUNTER — Encounter: Payer: Self-pay | Admitting: Gastroenterology

## 2020-11-02 ENCOUNTER — Other Ambulatory Visit (INDEPENDENT_AMBULATORY_CARE_PROVIDER_SITE_OTHER): Payer: BC Managed Care – PPO

## 2020-11-02 VITALS — BP 100/70 | HR 70 | Ht 66.0 in | Wt 144.2 lb

## 2020-11-02 DIAGNOSIS — R1084 Generalized abdominal pain: Secondary | ICD-10-CM

## 2020-11-02 DIAGNOSIS — R140 Abdominal distension (gaseous): Secondary | ICD-10-CM

## 2020-11-02 DIAGNOSIS — K6389 Other specified diseases of intestine: Secondary | ICD-10-CM

## 2020-11-02 DIAGNOSIS — K900 Celiac disease: Secondary | ICD-10-CM

## 2020-11-02 DIAGNOSIS — K5904 Chronic idiopathic constipation: Secondary | ICD-10-CM | POA: Diagnosis not present

## 2020-11-02 LAB — CBC WITH DIFFERENTIAL/PLATELET
Basophils Absolute: 0 10*3/uL (ref 0.0–0.1)
Basophils Relative: 0.7 % (ref 0.0–3.0)
Eosinophils Absolute: 0.1 10*3/uL (ref 0.0–0.7)
Eosinophils Relative: 1.3 % (ref 0.0–5.0)
HCT: 43.3 % (ref 36.0–46.0)
Hemoglobin: 14.6 g/dL (ref 12.0–15.0)
Lymphocytes Relative: 34.5 % (ref 12.0–46.0)
Lymphs Abs: 2 10*3/uL (ref 0.7–4.0)
MCHC: 33.7 g/dL (ref 30.0–36.0)
MCV: 86.6 fl (ref 78.0–100.0)
Monocytes Absolute: 0.4 10*3/uL (ref 0.1–1.0)
Monocytes Relative: 7.4 % (ref 3.0–12.0)
Neutro Abs: 3.2 10*3/uL (ref 1.4–7.7)
Neutrophils Relative %: 56.1 % (ref 43.0–77.0)
Platelets: 228 10*3/uL (ref 150.0–400.0)
RBC: 5 Mil/uL (ref 3.87–5.11)
RDW: 13.1 % (ref 11.5–14.6)
WBC: 5.7 10*3/uL (ref 4.5–10.5)

## 2020-11-02 LAB — COMPREHENSIVE METABOLIC PANEL
ALT: 12 U/L (ref 0–35)
AST: 16 U/L (ref 0–37)
Albumin: 5 g/dL (ref 3.5–5.2)
Alkaline Phosphatase: 68 U/L (ref 39–117)
BUN: 10 mg/dL (ref 6–23)
CO2: 31 mEq/L (ref 19–32)
Calcium: 10.2 mg/dL (ref 8.4–10.5)
Chloride: 101 mEq/L (ref 96–112)
Creatinine, Ser: 0.67 mg/dL (ref 0.40–1.20)
GFR: 125.5 mL/min (ref >60.00)
Glucose, Bld: 89 mg/dL (ref 70–99)
Potassium: 4.3 mEq/L (ref 3.5–5.1)
Sodium: 137 mEq/L (ref 135–145)
Total Bilirubin: 0.5 mg/dL (ref 0.2–1.2)
Total Protein: 8.4 g/dL — ABNORMAL HIGH (ref 6.0–8.3)

## 2020-11-02 MED ORDER — DILTIAZEM GEL 2 %: 1.0000 "application " | Freq: Two times a day (BID) | CUTANEOUS | 1 refills | Status: DC

## 2020-11-02 NOTE — Progress Notes (Addendum)
Veronica Ayala    [id number]    2001/01/04  Primary Care Physician:Timberlake, Anastasia Pall, MD  Referring Physician: Lodema Pilot, MD [place], [address] [place],  [place] [address]   Chief complaint:  Abdominal pain , celiac disease  HPI: Veronica Ayala is a very pleasant 20 year old female  is here for celiac disease, persistent abdominal bloating, generalized abdominal pain, decreased appetite and constipation  Complains of abdominal fullness, decreased appetite, sensation of feeling full and bloating even after a small meal.  She has intermittent cramping in the lower abdomen.  No dysphagia, vomiting or melena.  She has intermittent constipation   She was previously followed by pediatric gastroenterology at [facility] She was diagnosed celiac disease in December 2019 and also was noted to be H.pylori positive.  She completed treatment for H. pylori and had confirmed eradication  She started college 2021, she is trying to eat gluten free but has persistently elevated TTG IgA antibody since November 2021  It was low in July 2021 after she was treated with a course of budesonide before she started school at [employer] 07/16/19 01/28/19 06/30/18 12/05/17  TISSUE TRANSGLUTAMINASE IGA ANTIBODY   2  5.6 High   7.4 High   17.9 High     Component Ref Range & Units 03/28/20 12/09/19         (tTG) Ab, IgA U/mL 21.2 High   24.4 High  CM   Comment: Value          Interpretation         She has been adhering to strictly gluten-free diet since she was diagnosed with celiac.  She does eat out but prepares most of her meals and is trying diligently to avoid gluten.  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     Denies any skin rash or joint pain.   Her weight has been overall stable, denies any significant weight loss or weight gain but reported frequent fluctuation in her weight and inability to find the right  clothes  She can go without eating anything for whole day, feels full even after eating just an apple.  She was frustrated that she had no improvement after taking Xifaxan for 14 days even though the SIBO test was indeterminate, her mom had to drive to give her the free samples that were provided by the office   EGD and colonoscopy December 2019, EGD February 2020 and most recent EGD July 2021.  Unable to retrieve the procedure reports through care everywhere     EGD 07/16/2019 A.  DUODENUM, BIOPSY:       Duodenal mucosa with focal villous blunting and increased  intraepithelial lymphocytes (Marsh type 3a).   B.  STOMACH, BIOPSY:       Antral-type gastric mucosa with chronic inflammation.       No Helicobacter-like organism is identified by H&E stain  or immunohistochemistry.   H.pylori Clo Test Negative 07/16/2019   MRI entero abd & pelvis with and without contrast 06/20/20: Stomach/Bowel: The distal esophagus and stomach appear unremarkable. The duodenal bulb and the remainder of the duodenum demonstrates normal caliber and wall appearance without specific inflammatory findings.   Jejunal folds appear unremarkable and non-thickened at this time. No dilated bowel or abnormal bowel wall mucosal enhancement. The terminal ileum appears normal and no specific mass is observed. Distal colonic stool appears semi solid with some fluid levels  in the descending colon. Overall no active inflammation or specific abnormality is identified.   Vascular/Lymphatic:  Unremarkable   Reproductive: Unremarkable   Other:  Trace free pelvic fluid.  Outpatient Encounter Medications as of 11/02/2020  Medication Sig   [DISCONTINUED] dicyclomine (BENTYL) 10 MG capsule Take 1 capsule (10 mg total) by mouth 3 (three) times daily as needed for spasms. (Patient taking differently: Take 10 mg by mouth as needed for spasms.)   [DISCONTINUED] pantoprazole (PROTONIX) 20 MG tablet Take 20 mg by mouth as needed.    No facility-administered encounter medications on file as of 11/02/2020.    Allergies as of 11/02/2020 - Review Complete 11/02/2020  Allergen Reaction Noted   Gluten meal  01/30/2018    Past Medical History:  Diagnosis Date   Celiac disease    Constipation    IBS (irritable bowel syndrome)     Past Surgical History:  Procedure Laterality Date   COLONOSCOPY     2019   LAPAROSCOPIC APPENDECTOMY N/A 05/24/2019   Procedure: APPENDECTOMY LAPAROSCOPIC;  Surgeon: Jovita Kussmaul, MD;  Location: [facility];  Service: General;  Laterality: N/A;   UPPER GASTROINTESTINAL ENDOSCOPY      Family History  Problem Relation Age of Onset   Breast cancer Maternal Grandmother    Prostate cancer Maternal Grandfather     Social History   Socioeconomic History   Marital status: Single    Spouse name: Not on file   Number of children: Not on file   Years of education: Not on file   Highest education level: Not on file  Occupational History   Not on file  Tobacco Use   Smoking status: Never   Smokeless tobacco: Never  Vaping Use   Vaping Use: Never used  Substance and Sexual Activity   Alcohol use: Never   Drug use: Never   Sexual activity: Yes    Birth control/protection: None  Other Topics Concern   Not on file  Social History Narrative   Not on file   Social Determinants of Health   Financial Resource Strain: Not on file  Food Insecurity: Not on file  Transportation Needs: Not on file  Physical Activity: Not on file  Stress: Not on file  Social Connections: Not on file  Intimate Partner Violence: Not on file      Review of systems: All other review of systems negative except as mentioned in the HPI.   Physical Exam: Vitals:   11/02/20 1042  BP: 100/70  Pulse: 70   Body mass index is 23.28 kg/m. Gen:      No acute distress HEENT:  sclera anicteric Abd:      soft, non-tender; no palpable masses, no distension Ext:    No edema Neuro: alert and oriented x  3 Psych: normal mood and affect  Data Reviewed:  Reviewed labs, radiology imaging, old records and pertinent past GI work up   Assessment and Plan/Recommendations:  20 year old female with history of H. 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 Dyspepsia, abdominal bloating, abdominal pain and irregular bowel habits could all be related to refractory celiac disease  Follow-up TTG IgA  antibody, CBC and CMP Refer to [facility] for further management of refractory celiac disease, and evaluate for potential to be included in clinical trial with Larazotide  Discussed in detail the need to strictly adhere with gluten-free diet I offered to connect her with other patients or patient groups and also recommended checking celiac foundation website   She is extremely frustrated and overwhelmed with ongoing symptoms, she does not feel her symptoms are related to exposure to gluten.  She had testing for SIBO but was indeterminant, no improvement after empiric 14 day course of Xifaxan.  Work-up negative for inflammatory bowel disease or Crohn's and MRI entero negative for any significant jejunal thickening or lesions  Anal fissure: Use diltiazem gel 2-3 times daily for 3 to 4 weeks  This visit required >60 minutes of patient care (this includes precharting, chart review, review of results, face-to-face time used for counseling as well as treatment plan and follow-up. The patient was provided an opportunity to ask questions and all were answered. The patient agreed with the  plan and demonstrated an understanding of the instructions.  Damaris Hippo , MD    CC: Lodema Pilot, MD

## 2020-11-02 NOTE — Patient Instructions (Signed)
Your provider has requested that you go to the basement level for lab work before leaving today. Press "B" on the elevator. The lab is located at the first door on the left as you exit the elevator.   We will refer you to [facility], They will contact you with that appointment  Diltiazem gel will be sent yo your pharmacy   Due to recent changes in healthcare laws, you may see the results of your imaging and laboratory studies on MyChart before your provider has had a chance to review them.  We understand that in some cases there may be results that are confusing or concerning to you. Not all laboratory results come back in the same time frame and the provider may be waiting for multiple results in order to interpret others.  Please give [place] 48 hours in order for your provider to thoroughly review all the results before contacting the office for clarification of your results.    Thank you for choosing [facility]  Karleen Hampshire Nandigam,MD

## 2020-11-03 LAB — TISSUE TRANSGLUTAMINASE ABS,IGG,IGA
(tTG) Ab, IgA: 22.3 U/mL — ABNORMAL HIGH
(tTG) Ab, IgG: 2.5 U/mL

## 2020-11-06 LAB — CELIAC PANEL 10
Antigliadin Abs, IgA: 3 units (ref 0–19)
Endomysial IgA: POSITIVE — AB
Gliadin IgG: 5 units (ref 0–19)
IgA/Immunoglobulin A, Serum: 28 mg/dL — ABNORMAL LOW (ref 87–352)
Tissue Transglut Ab: 9 U/mL — ABNORMAL HIGH (ref 0–5)
Transglutaminase IgA: 5 U/mL — ABNORMAL HIGH (ref 0–3)

## 2020-11-07 ENCOUNTER — Telehealth: Payer: Self-pay | Admitting: *Deleted

## 2020-11-07 NOTE — Telephone Encounter (Signed)
Sent referral and faxed over [facility] referral form with records today to [facility] for Celiac Disease

## 2020-11-08 ENCOUNTER — Other Ambulatory Visit: Payer: Self-pay

## 2020-11-08 DIAGNOSIS — Z719 Counseling, unspecified: Secondary | ICD-10-CM

## 2020-11-08 DIAGNOSIS — K900 Celiac disease: Secondary | ICD-10-CM

## 2020-11-21 ENCOUNTER — Telehealth: Payer: Self-pay

## 2020-11-21 NOTE — Telephone Encounter (Signed)
Patient is requesting information on connecting her with other Celiac patients or patient groups. Looks like this was mentioned in her office visit. Please advise.

## 2020-11-22 ENCOUNTER — Telehealth: Payer: Self-pay

## 2020-11-22 DIAGNOSIS — Z23 Encounter for immunization: Secondary | ICD-10-CM | POA: Diagnosis not present

## 2020-11-22 NOTE — Telephone Encounter (Signed)
Called patient and left message on her voice mail with Baker Janus Little's name and phone number. This is per Dr. Silverio Decamp. Patient agreed.

## 2020-11-22 NOTE — Telephone Encounter (Signed)
Unfortunately the patient I was trying to connect her with is busy with her schedule and did not think she will have time to connect or support at this point I am checking with my other patients that have celiac disease to see if they are willing to provide support.  Please advise patient to check celiac foundation website, it has useful resources and information.  Thank you

## 2020-11-22 NOTE — Telephone Encounter (Signed)
I have advised patient to please check the Celiac website for further information

## 2020-12-15 NOTE — Telephone Encounter (Signed)
Called [facility] today to check on referral. The office was closed . Will try again on Monday

## 2020-12-18 DIAGNOSIS — R051 Acute cough: Secondary | ICD-10-CM | POA: Diagnosis not present

## 2020-12-18 DIAGNOSIS — J069 Acute upper respiratory infection, unspecified: Secondary | ICD-10-CM | POA: Diagnosis not present

## 2020-12-23 ENCOUNTER — Encounter: Payer: Self-pay | Admitting: Gastroenterology

## 2020-12-23 ENCOUNTER — Telehealth: Payer: Self-pay

## 2020-12-23 NOTE — Telephone Encounter (Signed)
Hi,  I got a message that said I was referred to [facility] health by you, and I called them to check up on this. I assumed this was for the celiac medication study, but they told me they were not in charge of this and were only aware of a referral for the clinic. I was wondering what the referral was for/if this is separate from the medication (which I have yet to hear about from [facility]) Thank you!

## 2021-01-12 ENCOUNTER — Encounter: Payer: Self-pay | Admitting: Gastroenterology

## 2021-01-18 ENCOUNTER — Encounter: Payer: Self-pay | Admitting: Gastroenterology

## 2021-01-19 NOTE — Telephone Encounter (Signed)
[facility], They have tried numerous times to contact the patient to schedule her referral.   Patient returned their text and said she didn't know this [facility] appointment was a consult.... She told them she would call back at a later time to schedule   Dr Darleen Crocker

## 2021-01-25 ENCOUNTER — Encounter: Payer: Self-pay | Admitting: Gastroenterology

## 2021-01-25 NOTE — Telephone Encounter (Signed)
I did discuss with patient in detail the reason for referral, she should schedule an appointment with [facility].

## 2021-02-01 ENCOUNTER — Other Ambulatory Visit: Payer: Self-pay

## 2021-02-01 ENCOUNTER — Telehealth: Payer: Self-pay

## 2021-02-01 DIAGNOSIS — R140 Abdominal distension (gaseous): Secondary | ICD-10-CM

## 2021-02-01 DIAGNOSIS — R1084 Generalized abdominal pain: Secondary | ICD-10-CM

## 2021-02-01 DIAGNOSIS — K900 Celiac disease: Secondary | ICD-10-CM

## 2021-02-01 MED ORDER — BUDESONIDE 3 MG PO CPEP: 3.0000 mg | ORAL_CAPSULE | Freq: Every day | ORAL | 0 refills | Status: DC

## 2021-02-01 NOTE — Telephone Encounter (Signed)
Patient's mother returned phone call. She agreed to have patient come into our lab for the labs Dr. Silverio Decamp recommended and also to my ordering the Budesonide for the patient. Order sent to her pharmacy and I gave instructions to the mother for taking.

## 2021-02-01 NOTE — Telephone Encounter (Signed)
Left message for patient to please call back

## 2021-02-06 ENCOUNTER — Encounter: Payer: Self-pay | Admitting: Gastroenterology

## 2021-02-25 ENCOUNTER — Other Ambulatory Visit: Payer: Self-pay | Admitting: Gastroenterology

## 2021-03-01 DIAGNOSIS — R41840 Attention and concentration deficit: Secondary | ICD-10-CM | POA: Diagnosis not present

## 2021-03-05 ENCOUNTER — Encounter: Payer: Self-pay | Admitting: Gastroenterology

## 2021-03-06 ENCOUNTER — Encounter: Payer: Self-pay | Admitting: Gastroenterology

## 2021-03-07 ENCOUNTER — Other Ambulatory Visit: Payer: Self-pay

## 2021-03-07 ENCOUNTER — Encounter: Payer: Self-pay | Admitting: Gastroenterology

## 2021-03-07 ENCOUNTER — Telehealth: Payer: Self-pay

## 2021-03-07 ENCOUNTER — Other Ambulatory Visit: Payer: Self-pay | Admitting: Gastroenterology

## 2021-03-07 MED ORDER — HYOSCYAMINE SULFATE 0.125 MG SL SUBL: SUBLINGUAL_TABLET | SUBLINGUAL | 0 refills | Status: DC

## 2021-03-07 NOTE — Telephone Encounter (Signed)
Communicated this with the patient.  Prescription to [facility] in [place].

## 2021-03-07 NOTE — Telephone Encounter (Signed)
The patient is scheduled for 03/17/21. She has Fridays open on her schedule.  She has not gotten the labs yet and I have asked her if she can come this Friday to take care of this. She is sending messages through My Chart. She is having a lot of "abdominal discomfort" and reports her food is "not digesting right." She is experiencing discomfort "even when I eat simple fruit." She wants to know if there is anything she can do for the next week to be more comfortable? She does not feel there has been any improvement on budesonide.

## 2021-03-07 NOTE — Telephone Encounter (Signed)
Please send prescription for hyoscyamine sublingual 0.125 mg every 6-8 hours as needed X 90 tablets with no refills.  Please advise her to come in for labs as soon as possible.  We will need to set her up for EGD to evaluate further for celiac disease.

## 2021-03-10 ENCOUNTER — Encounter: Payer: Self-pay | Admitting: Gastroenterology

## 2021-03-10 ENCOUNTER — Ambulatory Visit: Payer: BC Managed Care – PPO | Admitting: Gastroenterology

## 2021-03-10 ENCOUNTER — Other Ambulatory Visit (INDEPENDENT_AMBULATORY_CARE_PROVIDER_SITE_OTHER): Payer: BC Managed Care – PPO

## 2021-03-10 VITALS — BP 110/74 | HR 80 | Ht 66.0 in | Wt 135.2 lb

## 2021-03-10 DIAGNOSIS — K581 Irritable bowel syndrome with constipation: Secondary | ICD-10-CM

## 2021-03-10 DIAGNOSIS — K900 Celiac disease: Secondary | ICD-10-CM | POA: Diagnosis not present

## 2021-03-10 DIAGNOSIS — R110 Nausea: Secondary | ICD-10-CM | POA: Diagnosis not present

## 2021-03-10 DIAGNOSIS — K219 Gastro-esophageal reflux disease without esophagitis: Secondary | ICD-10-CM | POA: Diagnosis not present

## 2021-03-10 DIAGNOSIS — Z79891 Long term (current) use of opiate analgesic: Secondary | ICD-10-CM | POA: Diagnosis not present

## 2021-03-10 DIAGNOSIS — R140 Abdominal distension (gaseous): Secondary | ICD-10-CM

## 2021-03-10 DIAGNOSIS — R1010 Upper abdominal pain, unspecified: Secondary | ICD-10-CM | POA: Diagnosis not present

## 2021-03-10 DIAGNOSIS — R1084 Generalized abdominal pain: Secondary | ICD-10-CM

## 2021-03-10 DIAGNOSIS — F902 Attention-deficit hyperactivity disorder, combined type: Secondary | ICD-10-CM | POA: Diagnosis not present

## 2021-03-10 LAB — CBC WITH DIFFERENTIAL/PLATELET
Basophils Absolute: 0 10*3/uL (ref 0.0–0.1)
Basophils Relative: 0.8 % (ref 0.0–3.0)
Eosinophils Absolute: 0.2 10*3/uL (ref 0.0–0.7)
Eosinophils Relative: 2.5 % (ref 0.0–5.0)
HCT: 41.3 % (ref 36.0–46.0)
Hemoglobin: 14 g/dL (ref 12.0–15.0)
Lymphocytes Relative: 47.1 % — ABNORMAL HIGH (ref 12.0–46.0)
Lymphs Abs: 2.9 10*3/uL (ref 0.7–4.0)
MCHC: 34 g/dL (ref 30.0–36.0)
MCV: 84.5 fl (ref 78.0–100.0)
Monocytes Absolute: 0.5 10*3/uL (ref 0.1–1.0)
Monocytes Relative: 8.3 % (ref 3.0–12.0)
Neutro Abs: 2.6 10*3/uL (ref 1.4–7.7)
Neutrophils Relative %: 41.3 % — ABNORMAL LOW (ref 43.0–77.0)
Platelets: 248 10*3/uL (ref 150.0–400.0)
RBC: 4.89 Mil/uL (ref 3.87–5.11)
RDW: 13.2 % (ref 11.5–15.5)
WBC: 6.2 10*3/uL (ref 4.0–10.5)

## 2021-03-10 LAB — COMPREHENSIVE METABOLIC PANEL
ALT: 10 U/L (ref 0–35)
AST: 13 U/L (ref 0–37)
Albumin: 4.6 g/dL (ref 3.5–5.2)
Alkaline Phosphatase: 61 U/L (ref 39–117)
BUN: 8 mg/dL (ref 6–23)
CO2: 32 mEq/L (ref 19–32)
Calcium: 10 mg/dL (ref 8.4–10.5)
Chloride: 102 mEq/L (ref 96–112)
Creatinine, Ser: 0.7 mg/dL (ref 0.40–1.20)
GFR: 123.88 mL/min (ref >60.00)
Glucose, Bld: 87 mg/dL (ref 70–99)
Potassium: 4 mEq/L (ref 3.5–5.1)
Sodium: 138 mEq/L (ref 135–145)
Total Bilirubin: 0.6 mg/dL (ref 0.2–1.2)
Total Protein: 7.7 g/dL (ref 6.0–8.3)

## 2021-03-10 MED ORDER — OMEPRAZOLE 40 MG PO CPDR: 40.0000 mg | DELAYED_RELEASE_CAPSULE | Freq: Every day | ORAL | 3 refills | Status: DC

## 2021-03-10 MED ORDER — MERCAPTOPURINE 50 MG PO TABS: ORAL_TABLET | ORAL | 1 refills | Status: AC

## 2021-03-10 NOTE — Patient Instructions (Addendum)
Dr Silverio Decamp recommends that you complete a bowel purge (to clean out your bowels). Please do the following: Purchase a bottle-(238gram) of Miralax over the counter as well as a box of 5 mg dulcolax tablets. Take 4 dulcolax tablets. Wait 1 hour. You will then drink 6-8 capfuls of Miralax mixed in 32oz of water/juice/gatorade (you may choose which of these liquids to drink) over the next 2-3 hours. You should expect results within 1 to 6 hours after completing the bowel purge.  After bowel purge - start Miralax 1 capful daily.   We have sent the following medications to your pharmacy for you to pick up at your convenience: Omeprazole-Take 1 capsule by mouth once daily.  Mercaptopurine- Take 1/2 tablet by mouth once daily.  You have been scheduled for an endoscopy. Please follow written instructions given to you at your visit today. If you use inhalers (even only as needed), please bring them with you on the day of your procedure.  Your provider has requested that you go to the basement level for labs in 2 weeks on 03/24/2021. Press "B" on the elevator. The lab is located at the first door on the left as you exit the elevator.  Thank you for choosing me and [facility].  Dr.Nandigam

## 2021-03-10 NOTE — Progress Notes (Signed)
LESLIE JESTER    [id number]    [birth year]  Primary Care Physician:Timberlake, Anastasia Pall, MD  Referring Physician: Glenis Smoker, MD [place],  [place] [address]   Chief complaint:  Abdominal pain, constipation, bloating  HPI:  Ratasha is a very pleasant 21 year old female is here for follow up of celiac disease, persistent abdominal bloating, generalized abdominal pain, decreased appetite , nauseaand constipation   Complains of abdominal fullness, decreased appetite, sensation of feeling full and bloating even after a small meal.  She has generalized abdominal pain and intermittent cramping in the lower abdomen.  No dysphagia, vomiting or melena.  She is unable to eat much.   According to patient she is adhering to gluten free diet. She is drinking beer but always "drinks the same brand and it doesn't cause any issue". Explained to patient that beer contains gluten generally and she needs to check if its gluten free  She has been adhering to strictly gluten-free diet since she was diagnosed with celiac.  She does eat out but prepares most of her meals and is trying diligently to avoid gluten.  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     Denies any skin rash or joint pain.   Her weight has been overall stable, denies any significant weight loss or weight gain but reported frequent fluctuation in her weight and inability to find the right clothes   She can go without eating anything for whole day, feels full even after eating just an apple.  No improvement of symptoms with Budesonide.   Latest Reference Range & Units 03/28/20 12:35 11/02/20 12:00 03/10/21 08:15  (tTG) Ab, IgA U/mL 21.2 (H) 22.3 (H) 25.7 (H)  (H): Data is abnormally high    GI Hx:    She was frustrated that she had no improvement after taking Xifaxan for 14 days even though the SIBO test was indeterminate, her mom had to drive to give her the free samples that were provided by  the office  She was previously followed by pediatric gastroenterology at [facility] She was diagnosed celiac disease in December 2019 and also was noted to be H.pylori positive.  She completed treatment for H. pylori and had confirmed eradication   She started college 2021, she is trying to eat gluten free but has persistently elevated TTG IgA antibody since November 2021   It was low in July 2021 after she was treated with a course of budesonide before she started school at [employer] 07/16/19 01/28/19 06/30/18 12/05/17  TISSUE TRANSGLUTAMINASE IGA ANTIBODY   2  5.6 High   7.4 High   17.9 High         EGD and colonoscopy December 2019, EGD February 2020 and most recent EGD July 2021.  Unable to retrieve the procedure reports through care everywhere     EGD 07/16/2019 A.  DUODENUM, BIOPSY:       Duodenal mucosa with focal villous blunting and increased  intraepithelial lymphocytes (Marsh type 3a).   B.  STOMACH, BIOPSY:       Antral-type gastric mucosa with chronic inflammation.       No Helicobacter-like organism is identified by H&E stain  or immunohistochemistry.    H.pylori Clo Test Negative 07/16/2019   MRI entero abd & pelvis with and without contrast 06/20/20: Stomach/Bowel: The distal esophagus and stomach appear unremarkable. The duodenal bulb and the remainder of  the duodenum demonstrates normal caliber and wall appearance without specific inflammatory findings.   Jejunal folds appear unremarkable and non-thickened at this time. No dilated bowel or abnormal bowel wall mucosal enhancement. The terminal ileum appears normal and no specific mass is observed. Distal colonic stool appears semi solid with some fluid levels in the descending colon. Overall no active inflammation or specific abnormality is identified.   Vascular/Lymphatic:  Unremarkable   Reproductive: Unremarkable   Other:  Trace free pelvic fluid.   Outpatient Encounter  Medications as of 03/10/2021  Medication Sig   hyoscyamine (LEVSIN SL) 0.125 MG SL tablet 1 UNDER THE TONGUE EVERY 6 TO 8 HOURS AS NEEDED FOR CRAMPING AND ABDOMINAL PAIN   [DISCONTINUED] budesonide (ENTOCORT EC) 3 MG 24 hr capsule TAKE 1 CAPSULE (3 MG TOTAL) BY MOUTH DAILY. TAKE 3 CAPS DAILY FOR 2 WEEKS, THEN 2 CAPS DAILY FOR 2 WEEKS, THEN 1 CAP DAILY FOR 4 WEEKS, THEN STOP. (Patient not taking: Reported on 03/10/2021)   [DISCONTINUED] diltiazem 2 % GEL Apply 1 application topically 2 (two) times daily. Apply perianal area 4-6 weeks (Patient not taking: Reported on 03/10/2021)   No facility-administered encounter medications on file as of 03/10/2021.    Allergies as of 03/10/2021 - Review Complete 03/10/2021  Allergen Reaction Noted   Gluten meal  01/30/2018    Past Medical History:  Diagnosis Date   Celiac disease    Constipation    IBS (irritable bowel syndrome)     Past Surgical History:  Procedure Laterality Date   COLONOSCOPY     2019   LAPAROSCOPIC APPENDECTOMY N/A 05/24/2019   Procedure: APPENDECTOMY LAPAROSCOPIC;  Surgeon: Jovita Kussmaul, MD;  Location: [facility];  Service: General;  Laterality: N/A;   UPPER GASTROINTESTINAL ENDOSCOPY      Family History  Problem Relation Age of Onset   Breast cancer Maternal Grandmother    Prostate cancer Maternal Grandfather    Colon cancer Neg Hx    Esophageal cancer Neg Hx    Pancreatic cancer Neg Hx    Stomach cancer Neg Hx     Social History   Socioeconomic History   Marital status: Single    Spouse name: Not on file   Number of children: Not on file   Years of education: Not on file   Highest education level: Not on file  Occupational History   Not on file  Tobacco Use   Smoking status: Never   Smokeless tobacco: Never  Vaping Use   Vaping Use: Never used  Substance and Sexual Activity   Alcohol use: Never   Drug use: Never   Sexual activity: Yes    Birth control/protection: None  Other Topics Concern   Not on file   Social History Narrative   Not on file   Social Determinants of Health   Financial Resource Strain: Not on file  Food Insecurity: Not on file  Transportation Needs: Not on file  Physical Activity: Not on file  Stress: Not on file  Social Connections: Not on file  Intimate Partner Violence: Not on file      Review of systems: All other review of systems negative except as mentioned in the HPI.   Physical Exam: Vitals:   03/10/21 1130  BP: 110/74  Pulse: 80   Body mass index is 21.83 kg/m. Gen:      No acute distress HEENT:  sclera anicteric Abd:      soft, non-tender; no palpable masses, no distension Ext:  No edema Neuro: alert and oriented x 3 Psych: normal mood and affect  Data Reviewed:  Reviewed labs, radiology imaging, old records and pertinent past GI work up   Assessment and Plan/Recommendations:  21 year old female with history of H. 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  Based on response will plan to increase the dose of 6-MP  Discussed in detail the need to strictly adhere with gluten-free diet She was provided with multiple resources and  information including APPs and support groups in addition to celiac foundation  She was referred to [facility] for further management of refractory celiac disease, and evaluate for potential to be included in any ongoing clinical trials, awaiting appointment   She is extremely frustrated and overwhelmed with ongoing symptoms, she does not feel her symptoms are related to exposure to gluten. Explained to patient that work-up is negative for inflammatory bowel disease or Crohn's and MRI entero negative for any significant jejunal thickening or lesions   Irritable bowel syndrome- constipation: MiraLAX bowel purge followed by 1 capful MiraLAX daily  GERD and nausea: Start omeprazole 40 mg daily, 30 minutes before breakfast  Return in 2 months  This visit required >45 minutes of patient care (this includes precharting, chart review, review of results, face-to-face time used for counseling as well as treatment plan and follow-up. The patient was provided an opportunity to ask questions and all were answered. The patient agreed with the plan and demonstrated an understanding of the instructions.  Damaris Hippo , MD    CC: Glenis Smoker, *

## 2021-03-13 ENCOUNTER — Encounter: Payer: Self-pay | Admitting: Gastroenterology

## 2021-03-13 LAB — TISSUE TRANSGLUTAMINASE ABS,IGG,IGA
(tTG) Ab, IgA: 25.7 U/mL — ABNORMAL HIGH
(tTG) Ab, IgG: 1.8 U/mL

## 2021-03-15 ENCOUNTER — Encounter: Payer: Self-pay | Admitting: Gastroenterology

## 2021-03-15 ENCOUNTER — Telehealth: Payer: Self-pay | Admitting: Gastroenterology

## 2021-03-15 NOTE — Telephone Encounter (Signed)
Called the patient. No answer. Left her a voicemail of my return call. ?

## 2021-03-15 NOTE — Telephone Encounter (Signed)
Patient called and has questions regarding her celiac results.  Please call.  Thank you. ?

## 2021-03-16 NOTE — Telephone Encounter (Signed)
Patient is returning the call ?

## 2021-03-16 NOTE — Telephone Encounter (Signed)
Communication through My Chart. ?

## 2021-03-16 NOTE — Telephone Encounter (Signed)
Called the phone number given. This was the phone number for patient's mother. Patient and her mother are questioning the lab results. Specifically wanted to understand why her TTg labs  ?( Tissue Transglutaminase Abs,IgG,IgA ) results are higher now than when she was diagnosed, concerns that "something might have been missed" , and help with getting an appointment with a sub-specialist in Celiac disease. Total time on call was 33 minutes. ?To the best of my abilities, I explained it is possible that the variance in the labs could be related to ongoing hidden gluten exposure, and that it is possible for the numbers to rebound much higher if re-exposed after a period of abstinence from gluten. As to the referral to celiac specialist, I did explain that I would not have any influence in the appointment. Patient is on their cancellation list.  ?

## 2021-03-17 ENCOUNTER — Ambulatory Visit: Payer: BC Managed Care – PPO | Admitting: Gastroenterology

## 2021-03-17 NOTE — Telephone Encounter (Signed)
She has had extensive work-up, has been negative for any other etiology other than celiac disease.  I discussed with patient and provided her various resources to avoid accidental exposure to gluten and to strictly adhere to gluten-free diet.  Also explained to patient that she she cannot have gluten holiday or have any exposure to gluten with celiac disease. ?Elevated TTG IgA antibody is consistent with ongoing gluten exposure and the level can keep increasing with prolonged ongoing exposure. ?Patient was already referred to [facility], she declined appointments that were offered and is currently on wait list.  Unfortunately we do not have any influence on internal scheduling of [facility] ?

## 2021-03-23 ENCOUNTER — Encounter: Payer: Self-pay | Admitting: Gastroenterology

## 2021-03-27 DIAGNOSIS — K900 Celiac disease: Secondary | ICD-10-CM | POA: Diagnosis not present

## 2021-04-01 ENCOUNTER — Other Ambulatory Visit: Payer: Self-pay | Admitting: Gastroenterology

## 2021-04-10 ENCOUNTER — Encounter: Payer: Self-pay | Admitting: Gastroenterology

## 2021-04-10 ENCOUNTER — Telehealth: Payer: Self-pay

## 2021-04-10 ENCOUNTER — Other Ambulatory Visit (INDEPENDENT_AMBULATORY_CARE_PROVIDER_SITE_OTHER): Payer: BC Managed Care – PPO

## 2021-04-10 ENCOUNTER — Other Ambulatory Visit: Payer: Self-pay | Admitting: Gastroenterology

## 2021-04-10 ENCOUNTER — Ambulatory Visit (AMBULATORY_SURGERY_CENTER): Payer: BC Managed Care – PPO | Admitting: Gastroenterology

## 2021-04-10 VITALS — BP 104/63 | HR 62 | Temp 97.8°F | Resp 11 | Ht 66.0 in | Wt 135.0 lb

## 2021-04-10 DIAGNOSIS — R110 Nausea: Secondary | ICD-10-CM

## 2021-04-10 DIAGNOSIS — R1013 Epigastric pain: Secondary | ICD-10-CM

## 2021-04-10 DIAGNOSIS — R1010 Upper abdominal pain, unspecified: Secondary | ICD-10-CM

## 2021-04-10 DIAGNOSIS — K900 Celiac disease: Secondary | ICD-10-CM | POA: Diagnosis not present

## 2021-04-10 DIAGNOSIS — R140 Abdominal distension (gaseous): Secondary | ICD-10-CM

## 2021-04-10 DIAGNOSIS — K219 Gastro-esophageal reflux disease without esophagitis: Secondary | ICD-10-CM

## 2021-04-10 DIAGNOSIS — K581 Irritable bowel syndrome with constipation: Secondary | ICD-10-CM

## 2021-04-10 LAB — COMPREHENSIVE METABOLIC PANEL
ALT: 13 U/L (ref 0–35)
AST: 15 U/L (ref 0–37)
Albumin: 4.5 g/dL (ref 3.5–5.2)
Alkaline Phosphatase: 54 U/L (ref 39–117)
BUN: 7 mg/dL (ref 6–23)
CO2: 28 mEq/L (ref 19–32)
Calcium: 9.3 mg/dL (ref 8.4–10.5)
Chloride: 105 mEq/L (ref 96–112)
Creatinine, Ser: 0.56 mg/dL (ref 0.40–1.20)
GFR: 130.64 mL/min (ref >60.00)
Glucose, Bld: 87 mg/dL (ref 70–99)
Potassium: 3.8 mEq/L (ref 3.5–5.1)
Sodium: 140 mEq/L (ref 135–145)
Total Bilirubin: 0.7 mg/dL (ref 0.2–1.2)
Total Protein: 7.3 g/dL (ref 6.0–8.3)

## 2021-04-10 LAB — CBC WITH DIFFERENTIAL/PLATELET
Basophils Absolute: 0 10*3/uL (ref 0.0–0.1)
Basophils Relative: 0.8 % (ref 0.0–3.0)
Eosinophils Absolute: 0.1 10*3/uL (ref 0.0–0.7)
Eosinophils Relative: 1.2 % (ref 0.0–5.0)
HCT: 39.5 % (ref 36.0–46.0)
Hemoglobin: 13.4 g/dL (ref 12.0–15.0)
Lymphocytes Relative: 40.6 % (ref 12.0–46.0)
Lymphs Abs: 2.2 10*3/uL (ref 0.7–4.0)
MCHC: 34 g/dL (ref 30.0–36.0)
MCV: 85.4 fl (ref 78.0–100.0)
Monocytes Absolute: 0.4 10*3/uL (ref 0.1–1.0)
Monocytes Relative: 7.4 % (ref 3.0–12.0)
Neutro Abs: 2.7 10*3/uL (ref 1.4–7.7)
Neutrophils Relative %: 50 % (ref 43.0–77.0)
Platelets: 251 10*3/uL (ref 150.0–400.0)
RBC: 4.62 Mil/uL (ref 3.87–5.11)
RDW: 13.9 % (ref 11.5–15.5)
WBC: 5.3 10*3/uL (ref 4.0–10.5)

## 2021-04-10 MED ORDER — SODIUM CHLORIDE 0.9 % IV SOLN: 500.0000 mL | Freq: Once | INTRAVENOUS | Status: DC

## 2021-04-10 MED ORDER — OMEPRAZOLE 40 MG PO CPDR: 40.0000 mg | DELAYED_RELEASE_CAPSULE | Freq: Every day | ORAL | 3 refills | Status: DC

## 2021-04-10 NOTE — Telephone Encounter (Signed)
Patient here. Brings a different insurance card. [place] notified. ?

## 2021-04-10 NOTE — Progress Notes (Signed)
Called to room to assist during endoscopic procedure.  Patient ID and intended procedure confirmed with present staff. Received instructions for my participation in the procedure from the performing physician.  

## 2021-04-10 NOTE — Progress Notes (Signed)
[facility] History and Physical ? ? ?Primary Care Physician:  Glenis Smoker, MD ? ? ?Reason for Procedure:  Upper abdominal pain, nausea, celiac disease ? ?Plan:    EGD with possible interventions as needed ? ? ? ? ?HPI: Veronica Ayala is a very pleasant 21 y.o. female here for EGD for evaluation of persistent abdominal pain associated with nausea and bloating. She has h/o celiac disease that is refractory. ? ? ?The risks and benefits as well as alternatives of endoscopic procedure(s) have been discussed and reviewed. All questions answered. The patient agrees to proceed. ? ? ? ?Past Medical History:  ?Diagnosis Date  ? Celiac disease   ? Constipation   ? IBS (irritable bowel syndrome)   ? ? ?Past Surgical History:  ?Procedure Laterality Date  ? COLONOSCOPY    ? 2019  ? LAPAROSCOPIC APPENDECTOMY N/A 05/24/2019  ? Procedure: APPENDECTOMY LAPAROSCOPIC;  Surgeon: Jovita Kussmaul, MD;  Location: [facility];  Service: General;  Laterality: N/A;  ? UPPER GASTROINTESTINAL ENDOSCOPY    ? ? ?Prior to Admission medications   ?Medication Sig Start Date End Date Taking? Authorizing Provider  ?mercaptopurine (PURINETHOL) 50 MG tablet Take 1/2 tablet by mouth once daily. 03/10/21  Yes Mauri Pole, MD  ?omeprazole (PRILOSEC) 40 MG capsule Take 1 capsule (40 mg total) by mouth daily. 03/10/21  Yes Sybrina Laning, Venia Minks, MD  ?budesonide (ENTOCORT EC) 3 MG 24 hr capsule 3 CAPS DAILY X 2 WKS, 2 CAPS DAILY X 2 WKS, 1 CAP DAILY X 4 WEEKS 04/03/21   Irfan Veal, Venia Minks, MD  ?hyoscyamine (LEVSIN SL) 0.125 MG SL tablet 1 UNDER THE TONGUE EVERY 6 TO 8 HOURS AS NEEDED FOR CRAMPING AND ABDOMINAL PAIN 03/07/21   Mauri Pole, MD  ?VYVANSE 30 MG capsule Take 30 mg by mouth every morning. 03/10/21   [provider]  ? ? ?Current Outpatient Medications  ?Medication Sig Dispense Refill  ? mercaptopurine (PURINETHOL) 50 MG tablet Take 1/2 tablet by mouth once daily. 30 tablet 1  ? omeprazole (PRILOSEC) 40 MG capsule  Take 1 capsule (40 mg total) by mouth daily. 90 capsule 3  ? budesonide (ENTOCORT EC) 3 MG 24 hr capsule 3 CAPS DAILY X 2 WKS, 2 CAPS DAILY X 2 WKS, 1 CAP DAILY X 4 WEEKS 90 capsule 1  ? hyoscyamine (LEVSIN SL) 0.125 MG SL tablet 1 UNDER THE TONGUE EVERY 6 TO 8 HOURS AS NEEDED FOR CRAMPING AND ABDOMINAL PAIN 270 tablet 1  ? VYVANSE 30 MG capsule Take 30 mg by mouth every morning.    ? ?Current Facility-Administered Medications  ?Medication Dose Route Frequency Provider Last Rate Last Admin  ? 0.9 %  sodium chloride infusion  500 mL Intravenous Once Opie Maclaughlin, Venia Minks, MD      ? ? ?Allergies as of 04/10/2021 - Review Complete 04/10/2021  ?Allergen Reaction Noted  ? Gluten meal  01/30/2018  ? ? ?Family History  ?Problem Relation Age of Onset  ? Breast cancer Maternal Grandmother   ? Prostate cancer Maternal Grandfather   ? Colon cancer Neg Hx   ? Esophageal cancer Neg Hx   ? Pancreatic cancer Neg Hx   ? Stomach cancer Neg Hx   ? ? ?Social History  ? ?Socioeconomic History  ? Marital status: Single  ?  Spouse name: Not on file  ? Number of children: Not on file  ? Years of education: Not on file  ? Highest education level: Not on file  ?  Occupational History  ? Not on file  ?Tobacco Use  ? Smoking status: Never  ? Smokeless tobacco: Never  ?Vaping Use  ? Vaping Use: Never used  ?Substance and Sexual Activity  ? Alcohol use: Yes  ? Drug use: Never  ? Sexual activity: Yes  ?  Birth control/protection: None  ?Other Topics Concern  ? Not on file  ?Social History Narrative  ? Not on file  ? ?Social Determinants of Health  ? ?Financial Resource Strain: Not on file  ?Food Insecurity: Not on file  ?Transportation Needs: Not on file  ?Physical Activity: Not on file  ?Stress: Not on file  ?Social Connections: Not on file  ?Intimate Partner Violence: Not on file  ? ? ?Review of Systems: ? ?All other review of systems negative except as mentioned in the HPI. ? ?Physical Exam: ?Vital signs in last 24 hours: ?BP 102/62   Pulse (!)  59   Temp 97.8 ?F (36.6 ?C) (Temporal)   Ht 5' 6"  (1.676 m)   Wt 135 lb (61.2 kg)   SpO2 100%   BMI 21.79 kg/m?  ?General:   Alert, NAD ?Lungs:  Clear .   ?Heart:  Regular rate and rhythm ?Abdomen:  Soft, nontender and nondistended. ?Neuro/Psych:  Alert and cooperative. Normal mood and affect. A and O x 3 ? ?Reviewed labs, radiology imaging, old records and pertinent past GI work up ? ?Patient is appropriate for planned procedure(s) and anesthesia in an ambulatory setting ? ? ?K. Denzil Magnuson , MD ?[phone]  ? ? ?  ?

## 2021-04-10 NOTE — Patient Instructions (Signed)
Office visit in one month, Eustaquio Maize will call you to schedule. ? ?YOU HAD AN ENDOSCOPIC PROCEDURE TODAY AT [facility]:   Refer to the procedure report that was given to you for any specific questions about what was found during the examination.  If the procedure report does not answer your questions, please call your gastroenterologist to clarify.  If you requested that your care partner not be given the details of your procedure findings, then the procedure report has been included in a sealed envelope for you to review at your convenience later. ? ?YOU SHOULD EXPECT: Some feelings of bloating in the abdomen. Passage of more gas than usual.  Walking can help get rid of the air that was put into your GI tract during the procedure and reduce the bloating. If you had a lower endoscopy (such as a colonoscopy or flexible sigmoidoscopy) you may notice spotting of blood in your stool or on the toilet paper. If you underwent a bowel prep for your procedure, you may not have a normal bowel movement for a few days. ? ?Please Note:  You might notice some irritation and congestion in your nose or some drainage.  This is from the oxygen used during your procedure.  There is no need for concern and it should clear up in a day or so. ? ?SYMPTOMS TO REPORT IMMEDIATELY: ? ?Following upper endoscopy (EGD) ? Vomiting of blood or coffee ground material ? New chest pain or pain under the shoulder blades ? Painful or persistently difficult swallowing ? New shortness of breath ? Fever of 100?F or higher ? Black, tarry-looking stools ? ?For urgent or emergent issues, a gastroenterologist can be reached at any hour by calling [phone]. ?Do not use MyChart messaging for urgent concerns.  ? ? ?DIET:  We do recommend a small meal at first, but then you may proceed to your regular diet.  Drink plenty of fluids but you should avoid alcoholic beverages for 24 hours. ? ?ACTIVITY:  You should plan to take it easy for the rest  of today and you should NOT DRIVE or use heavy machinery until tomorrow (because of the sedation medicines used during the test).   ? ?FOLLOW UP: ?Our staff will call the number listed on your records 48-72 hours following your procedure to check on you and address any questions or concerns that you may have regarding the information given to you following your procedure. If we do not reach you, we will leave a message.  We will attempt to reach you two times.  During this call, we will ask if you have developed any symptoms of COVID 19. If you develop any symptoms (ie: fever, flu-like symptoms, shortness of breath, cough etc.) before then, please call [phone].  If you test positive for Covid 19 in the 2 weeks post procedure, please call and report this information to [place].   ? ?If any biopsies were taken you will be contacted by phone or by letter within the next 1-3 weeks.  Please call us at [phone] if you have not heard about the biopsies in 3 weeks.  ? ? ?SIGNATURES/CONFIDENTIALITY: ?You and/or your care partner have signed paperwork which will be entered into your electronic medical record.  These signatures attest to the fact that that the information above on your After Visit Summary has been reviewed and is understood.  Full responsibility of the confidentiality of this discharge information lies with you and/or your care-partner.  ?

## 2021-04-10 NOTE — Progress Notes (Signed)
Sedate, gd SR, tolerated procedure well, VSS, report to RN 

## 2021-04-10 NOTE — Telephone Encounter (Signed)
Insurance authorization for EGD today is still pending.  ?Patient notified that we may have to cancel at the very last minute because of this. ?

## 2021-04-10 NOTE — Op Note (Addendum)
[facility] ?Patient Name: Veronica Ayala ?Procedure Date: 04/10/2021 2:17 PM ?MRN: [id number] ?Endoscopist: Mauri Pole , MD ?Age: 21 ?Referring MD:  ?Date of Birth: Mar 20, 2000 ?Gender: Female ?Account #: [id number] ?Procedure:                Upper GI endoscopy ?Indications:              Epigastric abdominal pain, Upper abdominal pain,  ?                          Abdominal bloating ?Medicines:                Monitored Anesthesia Care ?Procedure:                Pre-Anesthesia Assessment: ?                          - Prior to the procedure, a History and Physical  ?                          was performed, and patient medications and  ?                          allergies were reviewed. The patient's tolerance of  ?                          previous anesthesia was also reviewed. The risks  ?                          and benefits of the procedure and the sedation  ?                          options and risks were discussed with the patient.  ?                          All questions were answered, and informed consent  ?                          was obtained. Prior Anticoagulants: The patient has  ?                          taken no previous anticoagulant or antiplatelet  ?                          agents. ASA Grade Assessment: II - A patient with  ?                          mild systemic disease. After reviewing the risks  ?                          and benefits, the patient was deemed in  ?                          satisfactory condition to undergo the procedure. ?  After obtaining informed consent, the endoscope was  ?                          passed under direct vision. Throughout the  ?                          procedure, the patient's blood pressure, pulse, and  ?                          oxygen saturations were monitored continuously. The  ?                          GIF [id number] #[id number] was introduced through the  ?                          mouth, and advanced to the second part  of duodenum.  ?                          The upper GI endoscopy was accomplished without  ?                          difficulty. The patient tolerated the procedure  ?                          well. ?Scope In: ?Scope Out: ?Findings:                 The Z-line was regular and was found 35 cm from the  ?                          incisors. ?                          The gastroesophageal flap valve was visualized  ?                          endoscopically and classified as Hill Grade II  ?                          (fold present, opens with respiration). ?                          The examined esophagus was normal. ?                          The stomach was normal. ?                          The cardia and gastric fundus were normal on  ?                          retroflexion. ?                          Decreased folds were found in the first portion of  ?  the duodenum, decreased folds were found in the  ?                          second portion of the duodenum, flattening was  ?                          found in the first portion of the duodenum,  ?                          flattening was found in the second portion of the  ?                          duodenum, scalloped mucosa was found in the first  ?                          portion of the duodenum and scalloped mucosa was  ?                          found in the second portion of the duodenum.  ?                          Biopsies were taken with a cold forceps for  ?                          histology. ?Complications:            No immediate complications. ?Estimated Blood Loss:     Estimated blood loss was minimal. ?Impression:               - Z-line regular, 35 cm from the incisors. ?                          - Gastroesophageal flap valve classified as Hill  ?                          Grade II (fold present, opens with respiration). ?                          - Normal esophagus. ?                          - Normal stomach. ?                           - Duodenal mucosal changes seen, diagnostic of  ?                          celiac disease. Biopsied. ?Recommendation:           - Gluten free diet indefinitely. ?                          - Continue present medications. ?                          - Omeprazole 60m daily, 30 minutes before  ?  breakfast. Rx 90 tabs with 3 refills ?                          - Continue 6 MP 36m daily. Check CBC, CMP and  ?                          TPMT, labs today ?                          - Await pathology results. ?                          - Return to GI office in 1 month. ?KMauri Pole MD ?04/10/2021 2:41:44 PM ?This report has been signed electronically. ?

## 2021-04-10 NOTE — Progress Notes (Signed)
Patient reported drinking  mostly vodka based drinks and also drinks ?gluten free from tap at the bar. "She is not sure when she is drunk what she drinks" but has been mostly avoiding beer, unclear if she is eating out at bars and restaurants.  ?Explained to her that based on testing so far it appears she is having ongoing gluten exposure and she needs to avoid gluten completely.  ? ?She did not do 2 week follow up labs after starting 6MP, asked her to do them today on the way out. Will keep the current dose of 6MP at 72m daily ?

## 2021-04-10 NOTE — Progress Notes (Signed)
VS completed by DT.    Medical history reviewed and updated.  

## 2021-04-11 ENCOUNTER — Encounter: Payer: Self-pay | Admitting: Gastroenterology

## 2021-04-12 ENCOUNTER — Telehealth: Payer: Self-pay

## 2021-04-12 NOTE — Telephone Encounter (Signed)
No answer, unable to leave a message, B.Dresean Beckel RN. ?

## 2021-04-12 NOTE — Telephone Encounter (Signed)
Patient returned the call states she is well no questions at this time. ?

## 2021-04-12 NOTE — Telephone Encounter (Signed)
Follow up call placed, VM has not been set up and RN unable to leave a message. ?SChaplin, RN,BSN ? ?

## 2021-04-13 ENCOUNTER — Encounter: Payer: Self-pay | Admitting: Gastroenterology

## 2021-04-18 ENCOUNTER — Encounter: Payer: Self-pay | Admitting: Gastroenterology

## 2021-04-19 ENCOUNTER — Other Ambulatory Visit: Payer: Self-pay

## 2021-04-19 MED ORDER — DEXLANSOPRAZOLE 60 MG PO CPDR: 60.0000 mg | DELAYED_RELEASE_CAPSULE | Freq: Every day | ORAL | 2 refills | Status: AC

## 2021-04-19 NOTE — Telephone Encounter (Signed)
Please send prescription for Dexilant 60 mg daily for 3 months, if not covered by insurance will have to do lansoprazole 30 mg twice daily, 30 minutes before breakfast and dinner.  Stop omeprazole once she fills the new prescription.   ?It may also help to avoid binge drinking and reduce or avoid alcohol intake in addition to gluten-free diet to decrease acid reflux symptoms ?

## 2021-04-20 DIAGNOSIS — F902 Attention-deficit hyperactivity disorder, combined type: Secondary | ICD-10-CM | POA: Diagnosis not present

## 2021-04-20 LAB — THIOPURINE METHYLTRANSFERASE (TPMT), RBC: Thiopurine Methyltransferase, RBC: 8 nmol/hr/mL RBC — ABNORMAL LOW

## 2021-04-24 ENCOUNTER — Encounter: Payer: Self-pay | Admitting: Gastroenterology

## 2021-05-01 ENCOUNTER — Other Ambulatory Visit: Payer: Self-pay

## 2021-05-01 DIAGNOSIS — K900 Celiac disease: Secondary | ICD-10-CM

## 2021-05-03 ENCOUNTER — Encounter: Payer: Self-pay | Admitting: Gastroenterology

## 2021-05-04 ENCOUNTER — Telehealth: Payer: Self-pay | Admitting: Gastroenterology

## 2021-05-04 NOTE — Telephone Encounter (Signed)
If she feels there is something stuck in her throat and is unable to swallow, she will need to come to ER for evaluation to exclude acute food impaction.  Patient was referred to [facility] for management of refractory symptoms of celiac disease, she will need to contact [facility] to expedite that appointment. ?

## 2021-05-04 NOTE — Telephone Encounter (Signed)
I have not called the mother back. The patient has been communicating through My Chart. ?

## 2021-05-04 NOTE — Telephone Encounter (Signed)
Spoke with Collene Mares. ?She has visited with the patient today. Patient describes a sensation of fullness. Demonstrates to her mother by hold her hand up to her throat. She does not describe dysphagia. She reports she states, "I ate an egg and a handful of blueberries and I feel like I ate a Thanksgiving meal." Further reports her abdomen appears bloated, the patient is taking Pepto-Bismol but this does not help, and she is being careful with her diet. She does not feel the Dexilant has been helpful. She "just want to know if there is anything else you can do." ?I asked about the [facility] appointment. States she is on the cancellation list and presently scheduled for the second opinion in October. [facility] still has the patient's records under review. Mother thanks me for the call and disconnects. ?

## 2021-05-04 NOTE — Telephone Encounter (Signed)
Patient mother called seeking advise. Per mother, patient is having extreme pain. States her throat feels like something stuck in there. Mother wants to know what can be done, do we advise the going to the ED? Please advise. ?

## 2021-05-10 ENCOUNTER — Encounter: Payer: BC Managed Care – PPO | Admitting: Gastroenterology

## 2021-05-16 ENCOUNTER — Other Ambulatory Visit: Payer: Self-pay

## 2021-05-16 DIAGNOSIS — Z713 Dietary counseling and surveillance: Secondary | ICD-10-CM | POA: Diagnosis not present

## 2021-05-16 NOTE — Telephone Encounter (Signed)
Patient called requesting a order for SIBO.  ?

## 2021-05-25 DIAGNOSIS — Z713 Dietary counseling and surveillance: Secondary | ICD-10-CM | POA: Diagnosis not present

## 2021-05-31 ENCOUNTER — Encounter: Payer: Self-pay | Admitting: Gastroenterology

## 2021-06-02 DIAGNOSIS — Z713 Dietary counseling and surveillance: Secondary | ICD-10-CM | POA: Diagnosis not present

## 2021-06-28 ENCOUNTER — Ambulatory Visit: Payer: BC Managed Care – PPO | Admitting: Gastroenterology

## 2021-08-07 DIAGNOSIS — Z713 Dietary counseling and surveillance: Secondary | ICD-10-CM | POA: Diagnosis not present

## 2021-08-08 DIAGNOSIS — Z01419 Encounter for gynecological examination (general) (routine) without abnormal findings: Secondary | ICD-10-CM | POA: Diagnosis not present

## 2021-08-08 DIAGNOSIS — Z124 Encounter for screening for malignant neoplasm of cervix: Secondary | ICD-10-CM | POA: Diagnosis not present

## 2021-08-08 DIAGNOSIS — Z6821 Body mass index (BMI) 21.0-21.9, adult: Secondary | ICD-10-CM | POA: Diagnosis not present

## 2021-08-19 DIAGNOSIS — J029 Acute pharyngitis, unspecified: Secondary | ICD-10-CM | POA: Diagnosis not present

## 2021-08-19 DIAGNOSIS — Z6822 Body mass index (BMI) 22.0-22.9, adult: Secondary | ICD-10-CM | POA: Diagnosis not present

## 2021-08-19 DIAGNOSIS — Z20828 Contact with and (suspected) exposure to other viral communicable diseases: Secondary | ICD-10-CM | POA: Diagnosis not present

## 2021-08-22 DIAGNOSIS — Z30017 Encounter for initial prescription of implantable subdermal contraceptive: Secondary | ICD-10-CM | POA: Diagnosis not present

## 2021-08-24 DIAGNOSIS — Z6822 Body mass index (BMI) 22.0-22.9, adult: Secondary | ICD-10-CM | POA: Diagnosis not present

## 2021-08-24 DIAGNOSIS — J029 Acute pharyngitis, unspecified: Secondary | ICD-10-CM | POA: Diagnosis not present

## 2021-08-24 DIAGNOSIS — Z202 Contact with and (suspected) exposure to infections with a predominantly sexual mode of transmission: Secondary | ICD-10-CM | POA: Diagnosis not present

## 2021-10-18 DIAGNOSIS — R110 Nausea: Secondary | ICD-10-CM | POA: Diagnosis not present

## 2021-10-18 DIAGNOSIS — K900 Celiac disease: Secondary | ICD-10-CM | POA: Diagnosis not present

## 2021-10-18 DIAGNOSIS — Z6821 Body mass index (BMI) 21.0-21.9, adult: Secondary | ICD-10-CM | POA: Diagnosis not present

## 2021-10-26 DIAGNOSIS — K900 Celiac disease: Secondary | ICD-10-CM | POA: Diagnosis not present

## 2021-11-10 DIAGNOSIS — Z113 Encounter for screening for infections with a predominantly sexual mode of transmission: Secondary | ICD-10-CM | POA: Diagnosis not present

## 2021-12-02 DIAGNOSIS — J0100 Acute maxillary sinusitis, unspecified: Secondary | ICD-10-CM | POA: Diagnosis not present

## 2021-12-02 DIAGNOSIS — Z6821 Body mass index (BMI) 21.0-21.9, adult: Secondary | ICD-10-CM | POA: Diagnosis not present

## 2021-12-05 DIAGNOSIS — F902 Attention-deficit hyperactivity disorder, combined type: Secondary | ICD-10-CM | POA: Diagnosis not present

## 2021-12-15 DIAGNOSIS — Z111 Encounter for screening for respiratory tuberculosis: Secondary | ICD-10-CM | POA: Diagnosis not present

## 2021-12-15 DIAGNOSIS — Z23 Encounter for immunization: Secondary | ICD-10-CM | POA: Diagnosis not present

## 2021-12-18 ENCOUNTER — Other Ambulatory Visit: Payer: Self-pay | Admitting: Gastroenterology

## 2021-12-18 DIAGNOSIS — Z021 Encounter for pre-employment examination: Secondary | ICD-10-CM | POA: Diagnosis not present

## 2022-01-22 DIAGNOSIS — Z79899 Other long term (current) drug therapy: Secondary | ICD-10-CM | POA: Diagnosis not present

## 2022-01-22 DIAGNOSIS — F902 Attention-deficit hyperactivity disorder, combined type: Secondary | ICD-10-CM | POA: Diagnosis not present

## 2022-01-22 DIAGNOSIS — Z23 Encounter for immunization: Secondary | ICD-10-CM | POA: Diagnosis not present

## 2022-01-22 DIAGNOSIS — R5383 Other fatigue: Secondary | ICD-10-CM | POA: Diagnosis not present

## 2022-01-22 DIAGNOSIS — Z0000 Encounter for general adult medical examination without abnormal findings: Secondary | ICD-10-CM | POA: Diagnosis not present

## 2022-01-22 DIAGNOSIS — D729 Disorder of white blood cells, unspecified: Secondary | ICD-10-CM | POA: Diagnosis not present

## 2022-01-24 DIAGNOSIS — F4323 Adjustment disorder with mixed anxiety and depressed mood: Secondary | ICD-10-CM | POA: Diagnosis not present

## 2022-01-26 DIAGNOSIS — F4323 Adjustment disorder with mixed anxiety and depressed mood: Secondary | ICD-10-CM | POA: Diagnosis not present

## 2022-01-26 DIAGNOSIS — F902 Attention-deficit hyperactivity disorder, combined type: Secondary | ICD-10-CM | POA: Diagnosis not present

## 2022-02-02 DIAGNOSIS — F4323 Adjustment disorder with mixed anxiety and depressed mood: Secondary | ICD-10-CM | POA: Diagnosis not present

## 2022-02-09 DIAGNOSIS — F4323 Adjustment disorder with mixed anxiety and depressed mood: Secondary | ICD-10-CM | POA: Diagnosis not present

## 2022-02-14 DIAGNOSIS — F902 Attention-deficit hyperactivity disorder, combined type: Secondary | ICD-10-CM | POA: Diagnosis not present

## 2022-02-14 DIAGNOSIS — F4323 Adjustment disorder with mixed anxiety and depressed mood: Secondary | ICD-10-CM | POA: Diagnosis not present

## 2022-03-12 DIAGNOSIS — F902 Attention-deficit hyperactivity disorder, combined type: Secondary | ICD-10-CM | POA: Diagnosis not present

## 2022-03-12 DIAGNOSIS — F4323 Adjustment disorder with mixed anxiety and depressed mood: Secondary | ICD-10-CM | POA: Diagnosis not present

## 2022-04-18 DIAGNOSIS — F902 Attention-deficit hyperactivity disorder, combined type: Secondary | ICD-10-CM | POA: Diagnosis not present

## 2022-04-18 DIAGNOSIS — F4323 Adjustment disorder with mixed anxiety and depressed mood: Secondary | ICD-10-CM | POA: Diagnosis not present

## 2022-06-01 DIAGNOSIS — F4323 Adjustment disorder with mixed anxiety and depressed mood: Secondary | ICD-10-CM | POA: Diagnosis not present

## 2022-06-01 DIAGNOSIS — F902 Attention-deficit hyperactivity disorder, combined type: Secondary | ICD-10-CM | POA: Diagnosis not present

## 2022-08-10 DIAGNOSIS — F4323 Adjustment disorder with mixed anxiety and depressed mood: Secondary | ICD-10-CM | POA: Diagnosis not present

## 2022-08-10 DIAGNOSIS — F902 Attention-deficit hyperactivity disorder, combined type: Secondary | ICD-10-CM | POA: Diagnosis not present
